# Patient Record
Sex: Male | Born: 1945
Health system: Southern US, Community
[De-identification: ages and names within clinical notes are randomized; demographics above are authoritative.]

## PROBLEM LIST (undated history)

## (undated) DIAGNOSIS — Z87442 Personal history of urinary calculi: Secondary | ICD-10-CM

## (undated) DIAGNOSIS — R002 Palpitations: Secondary | ICD-10-CM

## (undated) DIAGNOSIS — Z860100 Personal history of colon polyps, unspecified: Secondary | ICD-10-CM

## (undated) DIAGNOSIS — E119 Type 2 diabetes mellitus without complications: Secondary | ICD-10-CM

## (undated) DIAGNOSIS — R112 Nausea with vomiting, unspecified: Secondary | ICD-10-CM

## (undated) DIAGNOSIS — K635 Polyp of colon: Secondary | ICD-10-CM

## (undated) DIAGNOSIS — M1990 Unspecified osteoarthritis, unspecified site: Secondary | ICD-10-CM

## (undated) DIAGNOSIS — Z9289 Personal history of other medical treatment: Secondary | ICD-10-CM

## (undated) DIAGNOSIS — E785 Hyperlipidemia, unspecified: Secondary | ICD-10-CM

## (undated) HISTORY — DX: Type 2 diabetes mellitus without complications: E11.9

## (undated) HISTORY — PX: HERNIA REPAIR: SHX51

## (undated) HISTORY — DX: Unspecified osteoarthritis, unspecified site: M19.90

## (undated) HISTORY — PX: APPENDECTOMY: SHX54

## (undated) HISTORY — DX: Polyp of colon: K63.5

## (undated) HISTORY — DX: Hyperlipidemia, unspecified: E78.5

## (undated) HISTORY — DX: Nausea with vomiting, unspecified: R11.2

## (undated) HISTORY — DX: Personal history of other medical treatment: Z92.89

## (undated) HISTORY — PX: COLONOSCOPY: SHX174

## (undated) HISTORY — DX: Personal history of colon polyps, unspecified: Z86.0100

## (undated) HISTORY — PX: CHOLECYSTECTOMY: SHX55

---
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
## 2006-06-01 ENCOUNTER — Ambulatory Visit (HOSPITAL_COMMUNITY): Admission: RE | Admit: 2006-06-01 | Discharge: 2006-06-01 | Payer: Self-pay | Admitting: Internal Medicine

## 2007-07-14 ENCOUNTER — Ambulatory Visit (HOSPITAL_COMMUNITY): Admission: RE | Admit: 2007-07-14 | Discharge: 2007-07-14 | Payer: Self-pay | Admitting: Unknown Physician Specialty

## 2009-05-21 ENCOUNTER — Encounter (INDEPENDENT_AMBULATORY_CARE_PROVIDER_SITE_OTHER): Payer: Self-pay | Admitting: *Deleted

## 2009-05-27 ENCOUNTER — Encounter (INDEPENDENT_AMBULATORY_CARE_PROVIDER_SITE_OTHER): Payer: Self-pay | Admitting: *Deleted

## 2009-05-29 ENCOUNTER — Ambulatory Visit: Payer: Self-pay | Admitting: Internal Medicine

## 2009-06-03 ENCOUNTER — Ambulatory Visit: Payer: Self-pay | Admitting: Internal Medicine

## 2009-06-05 ENCOUNTER — Encounter: Payer: Self-pay | Admitting: Internal Medicine

## 2010-12-29 ENCOUNTER — Encounter (INDEPENDENT_AMBULATORY_CARE_PROVIDER_SITE_OTHER): Payer: Self-pay | Admitting: Surgery

## 2010-12-30 ENCOUNTER — Encounter (INDEPENDENT_AMBULATORY_CARE_PROVIDER_SITE_OTHER): Payer: Self-pay | Admitting: Surgery

## 2010-12-30 ENCOUNTER — Ambulatory Visit (INDEPENDENT_AMBULATORY_CARE_PROVIDER_SITE_OTHER): Payer: 59 | Admitting: Surgery

## 2010-12-30 VITALS — BP 142/86 | HR 76 | Ht 73.0 in | Wt 242.0 lb

## 2010-12-30 DIAGNOSIS — K8010 Calculus of gallbladder with chronic cholecystitis without obstruction: Secondary | ICD-10-CM

## 2010-12-30 DIAGNOSIS — K8020 Calculus of gallbladder without cholecystitis without obstruction: Secondary | ICD-10-CM | POA: Insufficient documentation

## 2010-12-30 NOTE — Progress Notes (Signed)
Cory Walls is a 65 y.o. male .    Chief Complaint  Patient presents with  . Cholelithiasis    eval gb w/stones    HPI HPI This is a 65 year old male who is a patient of Dr. Ivery Quale at [facility]. Recently he underwent his annual physical examination and had some blood work. This showed a mild elevation of his ALT which was felt to be from fatty liver. This may also be a side effect of his statin medication. Dr. Jarold Motto checked hepatitis panels which were negative. He then obtained a right upper quadrant ultrasound which revealed a solitary mobile gallstone within the gallbladder with no sign of cholecystitis. The common bile duct is normal in diameter. The patient now presents for discussion of elective cholecystectomy.  He had one episode last week where he had a sandwich and shortly thereafter, he experienced some RUQ abdominal pain with some mild bloating.  This resolved after an hour.  Past Medical History  Diagnosis Date  . Hyperlipidemia   Macular degeneration  Past Surgical History  Procedure Date  . Hernia repair   . Laparoscopic endo-rectal pull through for hirschsprung's disease   . Hirschsprung's resection   . Appendectomy      Family History  Problem Relation Age of Onset  . Heart disease Father     Social History History  Substance Use Topics  . Smoking status: Current Everyday Smoker -- 0.2 packs/day    Types: Cigarettes, Cigars  . Smokeless tobacco: Current User  . Alcohol Use: 0.6 oz/week    1 Glasses of wine per week    Allergies  Allergen Reactions  . Penicillins     REACTION: as child    Current Outpatient Prescriptions  Medication Sig Dispense Refill  . aspirin 81 MG tablet Take 81 mg by mouth daily.        . beta carotene w/minerals (OCUVITE) tablet Take 1 tablet by mouth 2 (two) times daily.       . fenofibrate micronized (LOFIBRA) 134 MG capsule Take 134 mg by mouth daily before breakfast.        . fish  oil-omega-3 fatty acids 1000 MG capsule Take 2 g by mouth daily.        . multivitamin-iron-minerals-folic acid (CENTRUM) chewable tablet Chew 1 tablet by mouth daily.        . pravastatin (PRAVACHOL) 40 MG tablet Take 40 mg by mouth daily.        . vitamin C (ASCORBIC ACID) 500 MG tablet Take 500 mg by mouth daily.        . vitamin E 400 UNIT capsule Take 400 Units by mouth daily.        Marland Kitchen ipratropium (ATROVENT) 0.03 % nasal spray Place 2 sprays into the nose 3 (three) times daily.        . mometasone (ELOCON) 0.1 % cream Apply 1 application topically daily.          Review of Systems ROS Reviewed - overall negative Physical Exam Physical Exam  WDWN in NAD HEENT:  EOMI, sclera anicteric Neck:  No masses, no thyromegaly Lungs:  CTA bilaterally; normal respiratory effort CV:  Regular rate and rhythm; no murmurs Abd:  +bowel sounds, soft, non-tender, no masses, healed left lower abdominal paramedian incision and R groin incision. Ext:  Well-perfused; no edema Skin:  Warm, dry; no sign of jaundice  Blood pressure 142/86, pulse 76, height 6\' 1"  (1.854 m), weight 242 lb (109.77 kg).  Assessment/Plan  Cholelithiasis with only one episode of symptoms. Mildly elevated transaminases, probably not related to the cholelithiasis.  Recommendations: The patient is currently having minimal symptoms with only one mild episode which may be referred to the cholelithiasis. However, I explained to the patient that he is likely to have increasing episodes of symptoms related to the gallstone. At this point he is in excellent health and would be an excellent surgical candidate. I would recommend an elective laparoscopic cholecystectomy with intraoperative cholangiogram.  His surgery may be slightly complicated by adhesions related to his previous surgeries.  I discussed the procedure in detail.  The patient was given Agricultural engineer.  We discussed the risks and benefits of a laparoscopic cholecystectomy  including, but not limited to bleeding, infection, injury to surrounding structures such as the intestine or liver, bile leak, retained gallstones, need to convert to an open procedure, prolonged diarrhea, blood clots such as  DVT, common bile duct injury, anesthesia risks, and possible need for additional procedures.  We discussed the typical post-operative recovery course.   Lamine Laton K. 12/30/2010, 4:41 PM

## 2010-12-30 NOTE — Patient Instructions (Signed)
Call our surgery schedulers at [phone] to schedule your surgery.  Maintain a lowfat diet.

## 2011-01-02 ENCOUNTER — Encounter (HOSPITAL_COMMUNITY)
Admission: RE | Admit: 2011-01-02 | Discharge: 2011-01-02 | Disposition: A | Discharge status: Home / Self Care | Payer: Managed Care, Other (non HMO) | Source: Ambulatory Visit | Attending: Surgery | Admitting: Surgery

## 2011-01-02 LAB — COMPREHENSIVE METABOLIC PANEL
ALT: 58 U/L — ABNORMAL HIGH (ref 0–53)
AST: 34 U/L (ref 0–37)
Alkaline Phosphatase: 51 U/L (ref 39–117)
BUN: 17 mg/dL (ref 6–23)
CO2: 27 mEq/L (ref 19–32)
Calcium: 9.7 mg/dL (ref 8.4–10.5)
Chloride: 104 mEq/L (ref 96–112)
Creatinine, Ser: 1.11 mg/dL (ref 0.50–1.35)
Total Protein: 7.5 g/dL (ref 6.0–8.3)

## 2011-01-02 LAB — PROTIME-INR
INR: 1.05 (ref 0.00–1.49)
Prothrombin Time: 13.9 seconds (ref 11.6–15.2)

## 2011-01-02 LAB — CBC
Hemoglobin: 15.7 g/dL (ref 13.0–17.0)
MCH: 30.1 pg (ref 26.0–34.0)
MCHC: 34.7 g/dL (ref 30.0–36.0)
Platelets: 223 10*3/uL (ref 150–400)
RDW: 13.4 % (ref 11.5–15.5)

## 2011-01-02 LAB — SURGICAL PCR SCREEN: Staphylococcus aureus: NEGATIVE

## 2011-01-02 LAB — APTT: aPTT: 40 seconds — ABNORMAL HIGH (ref 24–37)

## 2011-01-05 ENCOUNTER — Ambulatory Visit (INDEPENDENT_AMBULATORY_CARE_PROVIDER_SITE_OTHER): Payer: Self-pay | Admitting: Surgery

## 2011-01-06 ENCOUNTER — Other Ambulatory Visit (INDEPENDENT_AMBULATORY_CARE_PROVIDER_SITE_OTHER): Payer: Self-pay | Admitting: Surgery

## 2011-01-06 ENCOUNTER — Ambulatory Visit (HOSPITAL_COMMUNITY)
Admission: RE | Admit: 2011-01-06 | Discharge: 2011-01-06 | Disposition: A | Discharge status: Home / Self Care | Payer: Managed Care, Other (non HMO) | Source: Ambulatory Visit | Attending: Surgery | Admitting: Surgery

## 2011-01-06 DIAGNOSIS — Z79899 Other long term (current) drug therapy: Secondary | ICD-10-CM | POA: Insufficient documentation

## 2011-01-06 DIAGNOSIS — H3530 Unspecified macular degeneration: Secondary | ICD-10-CM | POA: Insufficient documentation

## 2011-01-06 DIAGNOSIS — F17200 Nicotine dependence, unspecified, uncomplicated: Secondary | ICD-10-CM | POA: Insufficient documentation

## 2011-01-06 DIAGNOSIS — E785 Hyperlipidemia, unspecified: Secondary | ICD-10-CM | POA: Insufficient documentation

## 2011-01-06 DIAGNOSIS — K8010 Calculus of gallbladder with chronic cholecystitis without obstruction: Secondary | ICD-10-CM | POA: Insufficient documentation

## 2011-01-06 DIAGNOSIS — K811 Chronic cholecystitis: Secondary | ICD-10-CM

## 2011-01-06 DIAGNOSIS — Z01812 Encounter for preprocedural laboratory examination: Secondary | ICD-10-CM | POA: Insufficient documentation

## 2011-01-06 DIAGNOSIS — Z7982 Long term (current) use of aspirin: Secondary | ICD-10-CM | POA: Insufficient documentation

## 2011-01-07 ENCOUNTER — Telehealth (INDEPENDENT_AMBULATORY_CARE_PROVIDER_SITE_OTHER): Payer: Self-pay

## 2011-01-07 NOTE — Telephone Encounter (Signed)
Mrs. Goodell called stating her husband became dizzy when he stood up this morning around 8:00 am lost his balance and  fell on his left side, she states he hit his head and now has bruising on left side of forehead, his foot is swollen with significant bruising, his abdominal incisions appear to be fine, there's no abdominal swelling or bruising at this time  Per Pattricia Boss, patient need's to go to ED for further evaluation. At this time,  they declined to do to the ED, but agreed to keep a watch for any physical changes to his abdomen and his incisions, head injury (appearance and mental status) and his left foot.  (S/P Lap chole)

## 2011-01-09 NOTE — Op Note (Addendum)
NAME:  Cory Walls, Cory Walls NO.:  [id number]  MEDICAL RECORD NO.:  [id number]  LOCATION:  XRAY                         FACILITY:  [facility]  PHYSICIAN:  Wilmon Arms. Corliss Skains, M.D. DATE OF BIRTH:  10-25-1945  DATE OF PROCEDURE:  01/06/2011 DATE OF DISCHARGE:                              OPERATIVE REPORT   PREOPERATIVE DIAGNOSIS:  Chronic calculous cholecystitis.  POSTOPERATIVE DIAGNOSIS:  Chronic calculous cholecystitis.  PROCEDURE:  Laparoscopic cholecystectomy with intraoperative cholangiogram.  SURGEON:  Wilmon Arms. Indiya Izquierdo, MD  ANESTHESIA:  General.  INDICATIONS:  This is a 65 year old male who was recently noted to have some mild elevation of his liver function tests.  An ultrasound showed a single mobile gallstone within the gallbladder with no sign of cholecystitis.  The patient has had one episode of some right upper quadrant pain that resolved spontaneously.  I discussed the situation with the patient and we have elected to pursue elective cholecystectomy.  DESCRIPTION OF PROCEDURE:  The patient was brought to the operating room and placed in the supine position on the operating room table.  After an adequate level of general anesthesia was obtained, the patient's abdomen was shaved and prepped with ChloraPrep and draped in a sterile fashion. Time-out was taken to ensure the proper patient and proper procedure. The patient has a previous left paramedian incision extending to above his umbilicus.  We made a transverse incision just below the umbilicus after infiltrating with 0.25% Marcaine with epinephrine.  Dissection was carried down to the fascia.  The fascia was incised vertically.  We bluntly entered the peritoneal cavity.  I could feel some adhesions inferior to our incision, but no significant adhesions superiorly.  We placed a stay suture of 0-Vicryl around the fascial opening.  We inserted the Hasson cannula and secured it with a stay  suture. Pneumoperitoneum was obtained by insufflating CO2 and maintaining a maximal pressure of 15 mmHg.  The laparoscope was inserted and the patient was positioned in reverse Trendelenburg tilted to his left.  The patient has a lot of adhesions in the upper abdomen with his omentum stuck to the liver and to the anterior abdominal wall.  I could not visualize the falciform ligament.  I could barely visualize the liver. The right upper quadrant did seem clear.  We placed two 5-mm ports in the right upper quadrant.  We then used cautery scissors to take down the adhesions to anterior abdominal wall in the upper abdomen.  We cleared completely until we were able to visualize the falciform ligament.  I then placed the 11-mm port in the subxiphoid position.  We then spent some time trying to dissect the omentum away from the liver and from the gallbladder.  The gallbladder itself appeared rather thin walled and normal, but there were significant adhesions to the edge of the liver.  We took these down with cautery scissors.  We were finally able to lift the gallbladder over the edge of the liver.  I peeled down the adhesions from the fundus of the gallbladder.  We were able to expose the infundibulum.  We then bluntly dissected around the cystic duct and cystic artery.  The cystic duct was ligated and clipped  distally.  A small opening was created on the cystic duct and a small opening was created on the cystic duct.  A Cook cholangiogram catheter was inserted through a stab incision and threaded in the cystic duct. It was secured with a clip.  A cholangiogram was then obtained which showed a slight leak back around the clips.  Contrast did flow into the duodenum without any sign of obstruction.  We had fairly poor flow proximally into the liver.  I was able to visualize the contrast flowing up in the liver but I did not visualize the bifurcation.  The patient's preoperative liver function  tests were within normal limits.  We then removed the catheter and ligated the cystic duct with clips and divided. We ligated the cystic artery with clips and divided.  Cautery was then used to dissect the gallbladder free from the liver.  We placed the gallbladder in a EndoCatch sac.  We then thoroughly irrigated the gallbladder fossa.  We cauterized for hemostasis.  We then removed the gallbladder in the EndoCatch sac through the umbilical port site.  The patient has a lot of adhesions to the left of his umbilicus as well as in the lower midline.  There actually seemed be some bowel stuck to the anterior abdominal wall but we were clear of this with our fascial incision.  The pursestring sutures were used to close the umbilical fascia.  Pneumoperitoneum was then released as we removed the trocars. 4-0 Monocryl was used to close the skin incisions.  Steri-Strips and clean dressings were applied.  The patient was then extubated and brought to the recovery room in stable condition.  All sponge, instrument, and needle counts were correct.     Wilmon Arms. Corliss Skains, M.D.     MKT/MEDQ  D:  01/06/2011  T:  01/06/2011  Job:  [id number]  Electronically Signed by Manus Rudd M.D. on 01/16/2011 07:31:43 AM

## 2011-01-20 ENCOUNTER — Encounter (INDEPENDENT_AMBULATORY_CARE_PROVIDER_SITE_OTHER): Payer: Self-pay | Admitting: Surgery

## 2011-01-20 ENCOUNTER — Ambulatory Visit (INDEPENDENT_AMBULATORY_CARE_PROVIDER_SITE_OTHER): Payer: Managed Care, Other (non HMO) | Admitting: Surgery

## 2011-01-20 DIAGNOSIS — Z9049 Acquired absence of other specified parts of digestive tract: Secondary | ICD-10-CM

## 2011-01-20 DIAGNOSIS — Z9889 Other specified postprocedural states: Secondary | ICD-10-CM

## 2011-01-20 DIAGNOSIS — K8010 Calculus of gallbladder with chronic cholecystitis without obstruction: Secondary | ICD-10-CM

## 2011-01-20 NOTE — Progress Notes (Signed)
Status post laparoscopic cholecystectomy with intraoperative cholangiogram. The pathology confirmed chronic calculus cholecystitis. The patient is doing well. Appetite is good. Occasionally he has a loose bowel movement with certain types of food. This seems to be improving. His incisions are well healed with no sign of infection. No sign of umbilical hernia.  The patient may resume full activity. Followup on a p.r.n. basis.

## 2011-08-13 DIAGNOSIS — H353121 Nonexudative age-related macular degeneration, left eye, early dry stage: Secondary | ICD-10-CM | POA: Insufficient documentation

## 2011-08-13 DIAGNOSIS — H25813 Combined forms of age-related cataract, bilateral: Secondary | ICD-10-CM | POA: Insufficient documentation

## 2012-03-28 ENCOUNTER — Encounter: Payer: Self-pay | Admitting: Internal Medicine

## 2012-05-11 ENCOUNTER — Encounter: Payer: Self-pay | Admitting: Internal Medicine

## 2012-05-11 ENCOUNTER — Ambulatory Visit (AMBULATORY_SURGERY_CENTER): Payer: Managed Care, Other (non HMO) | Admitting: *Deleted

## 2012-05-11 VITALS — Ht 72.0 in | Wt 242.2 lb

## 2012-05-11 DIAGNOSIS — Z1211 Encounter for screening for malignant neoplasm of colon: Secondary | ICD-10-CM

## 2012-05-11 MED ORDER — NA SULFATE-K SULFATE-MG SULF 17.5-3.13-1.6 GM/177ML PO SOLN: ORAL | Status: DC

## 2012-05-25 ENCOUNTER — Ambulatory Visit (AMBULATORY_SURGERY_CENTER): Payer: Managed Care, Other (non HMO) | Admitting: Internal Medicine

## 2012-05-25 ENCOUNTER — Encounter: Payer: Self-pay | Admitting: Internal Medicine

## 2012-05-25 VITALS — BP 122/73 | HR 72 | Temp 96.8°F | Resp 14 | Ht 72.0 in | Wt 242.0 lb

## 2012-05-25 DIAGNOSIS — Z860100 Personal history of colon polyps, unspecified: Secondary | ICD-10-CM | POA: Insufficient documentation

## 2012-05-25 DIAGNOSIS — D126 Benign neoplasm of colon, unspecified: Secondary | ICD-10-CM

## 2012-05-25 DIAGNOSIS — Z1211 Encounter for screening for malignant neoplasm of colon: Secondary | ICD-10-CM

## 2012-05-25 MED ORDER — SODIUM CHLORIDE 0.9 % IV SOLN: 500.0000 mL | INTRAVENOUS | Status: DC

## 2012-05-25 NOTE — Patient Instructions (Addendum)
Four small polyps were removed today.  I will let you know but suspect I will recommend another 3 year repeat. Will remind myself to sit down with you about the prep so we can try to prevent the nausea problems you have had the last 2 times.  Thank you for choosing me and [facility].  Iva Boop, MD, FACG   YOU HAD AN ENDOSCOPIC PROCEDURE TODAY AT THE [facility]: Refer to the procedure report that was given to you for any specific questions about what was found during the examination.  If the procedure report does not answer your questions, please call your gastroenterologist to clarify.  If you requested that your care partner not be given the details of your procedure findings, then the procedure report has been included in a sealed envelope for you to review at your convenience later.  YOU SHOULD EXPECT: Some feelings of bloating in the abdomen. Passage of more gas than usual.  Walking can help get rid of the air that was put into your GI tract during the procedure and reduce the bloating. If you had a lower endoscopy (such as a colonoscopy or flexible sigmoidoscopy) you may notice spotting of blood in your stool or on the toilet paper. If you underwent a bowel prep for your procedure, then you may not have a normal bowel movement for a few days.  DIET: Your first meal following the procedure should be a light meal and then it is ok to progress to your normal diet.  A half-sandwich or bowl of soup is an example of a good first meal.  Heavy or fried foods are harder to digest and may make you feel nauseous or bloated.  Likewise meals heavy in dairy and vegetables can cause extra gas to form and this can also increase the bloating.  Drink plenty of fluids but you should avoid alcoholic beverages for 24 hours.  ACTIVITY: Your care partner should take you home directly after the procedure.  You should plan to take it easy, moving slowly for the rest of the day.  You can  resume normal activity the day after the procedure however you should NOT DRIVE or use heavy machinery for 24 hours (because of the sedation medicines used during the test).    SYMPTOMS TO REPORT IMMEDIATELY: A gastroenterologist can be reached at any hour.  During normal business hours, 8:30 AM to 5:00 PM Monday through Friday, call [phone].  After hours and on weekends, please call the GI answering service at [phone] who will take a message and have the physician on call contact you.   Following lower endoscopy (colonoscopy or flexible sigmoidoscopy):  Excessive amounts of blood in the stool  Significant tenderness or worsening of abdominal pains  Swelling of the abdomen that is new, acute  Fever of 100F or higher  Following upper endoscopy (EGD)  Vomiting of blood or coffee ground material  New chest pain or pain under the shoulder blades  Painful or persistently difficult swallowing  New shortness of breath  Fever of 100F or higher  Black, tarry-looking stools  FOLLOW UP: If any biopsies were taken you will be contacted by phone or by letter within the next 1-3 weeks.  Call your gastroenterologist if you have not heard about the biopsies in 3 weeks.  Our staff will call the home number listed on your records the next business day following your procedure to check on you and address any questions or concerns  that you may have at that time regarding the information given to you following your procedure. This is a courtesy call and so if there is no answer at the home number and we have not heard from you through the emergency physician on call, we will assume that you have returned to your regular daily activities without incident.  SIGNATURES/CONFIDENTIALITY: You and/or your care partner have signed paperwork which will be entered into your electronic medical record.  These signatures attest to the fact that that the information above on your After Visit Summary has been  reviewed and is understood.  Full responsibility of the confidentiality of this discharge information lies with you and/or your care-partner.

## 2012-05-25 NOTE — Op Note (Addendum)
[facility] [address] [place], [address]   COLONOSCOPY PROCEDURE REPORT  PATIENT: Cory Walls, Cory Walls  MR#: [id number] BIRTHDATE: [birth year] , 66  yrs. old GENDER: Male ENDOSCOPIST: Iva Boop, MD, [facility] PROCEDURE DATE:  05/25/2012 PROCEDURE:   Colonoscopy with biopsy and snare polypectomy ASA CLASS:   Class II INDICATIONS:Screening and surveillance,personal history of colonic polyps and Patient's personal history of adenomatous colon polyps.  MEDICATIONS: MAC sedation, administered by CRNA, These medications were titrated to patient response per physician's verbal order, and propofol (Diprivan) 400mg  IV  DESCRIPTION OF PROCEDURE:   After the risks benefits and alternatives of the procedure were thoroughly explained, informed consent was obtained.  A digital rectal exam revealed decreased sphincter tone and A digital rectal exam revealed the prostate was not enlarged.   The LB [id number]  endoscope was introduced through the anus and advanced to the cecum, which was identified by both the appendix and ileocecal valve. No adverse events experienced.   The quality of the prep was Suprep adequate  The instrument was then slowly withdrawn as the colon was fully examined.      COLON FINDINGS: Four diminutive polypoid shaped sessile polyps were found in the ascending colon and descending colon.  A polypectomy was performed with cold forceps and with a cold snare.  The resection was complete and the polyp tissue was completely retrieved.   The colon mucosa was otherwise normal.   A right colon retroflexion was performed.  Retroflexed views revealed no abnormalities. The time to cecum=2 minutes 51 seconds.  Withdrawal time=15 minutes 46 seconds.  The scope was withdrawn and the procedure completed. COMPLICATIONS: There were no complications.  ENDOSCOPIC IMPRESSION: 1.   Four diminutive sessile polyps were found in the ascending colon and descending  colon; polypectomy was performed with cold forceps and with a cold snare 2.   The colon mucosa was otherwise normal with evidence of Hirschsprung surgery in rectum, adequate prep 3.   Prior adenomas ( 6 in 2006 and also before that)  RECOMMENDATIONS: Timing of repeat colonoscopy will be determined by pathology findings. Schedule office visit before next colonoscopy to treat for nausea with preps (x2 now)   eSigned:  Iva Boop, MD, [facility] 05/25/2012 12:47 PMRevised: 05/25/2012 12:47 PM cc: Jarome Matin, MD and The Patient

## 2012-05-25 NOTE — Progress Notes (Signed)
Patient did not experience any of the following events: a burn prior to discharge; a fall within the facility; wrong site/side/patient/procedure/implant event; or a hospital transfer or hospital admission upon discharge from the facility. (G8907) Patient did not have preoperative order for IV antibiotic SSI prophylaxis. (G8918)  

## 2012-05-25 NOTE — Progress Notes (Signed)
Called to room to assist during endoscopic procedure.  Patient ID and intended procedure confirmed with present staff. Received instructions for my participation in the procedure from the performing physician.  

## 2012-05-26 ENCOUNTER — Telehealth: Payer: Self-pay | Admitting: *Deleted

## 2012-05-26 NOTE — Telephone Encounter (Signed)
No answer or voicemail on f/u callback

## 2012-06-01 ENCOUNTER — Encounter: Payer: Self-pay | Admitting: Internal Medicine

## 2012-06-01 NOTE — Progress Notes (Signed)
Quick Note:  4 diminutive adenomas Repeat colon about 05/2015 ______

## 2013-03-07 DIAGNOSIS — D485 Neoplasm of uncertain behavior of skin: Secondary | ICD-10-CM | POA: Diagnosis not present

## 2013-03-07 DIAGNOSIS — L821 Other seborrheic keratosis: Secondary | ICD-10-CM | POA: Diagnosis not present

## 2013-03-07 DIAGNOSIS — D239 Other benign neoplasm of skin, unspecified: Secondary | ICD-10-CM | POA: Diagnosis not present

## 2013-04-05 ENCOUNTER — Other Ambulatory Visit: Payer: Self-pay | Admitting: Dermatology

## 2013-04-05 DIAGNOSIS — L723 Sebaceous cyst: Secondary | ICD-10-CM | POA: Diagnosis not present

## 2013-04-05 DIAGNOSIS — D1739 Benign lipomatous neoplasm of skin and subcutaneous tissue of other sites: Secondary | ICD-10-CM | POA: Diagnosis not present

## 2013-04-05 DIAGNOSIS — D485 Neoplasm of uncertain behavior of skin: Secondary | ICD-10-CM | POA: Diagnosis not present

## 2013-06-01 DIAGNOSIS — H2510 Age-related nuclear cataract, unspecified eye: Secondary | ICD-10-CM | POA: Diagnosis not present

## 2013-06-01 DIAGNOSIS — H353290 Exudative age-related macular degeneration, unspecified eye, stage unspecified: Secondary | ICD-10-CM | POA: Diagnosis not present

## 2013-06-01 DIAGNOSIS — H353190 Nonexudative age-related macular degeneration, unspecified eye, stage unspecified: Secondary | ICD-10-CM | POA: Diagnosis not present

## 2013-06-23 DIAGNOSIS — H3530 Unspecified macular degeneration: Secondary | ICD-10-CM | POA: Diagnosis not present

## 2013-06-26 DIAGNOSIS — D72829 Elevated white blood cell count, unspecified: Secondary | ICD-10-CM | POA: Diagnosis not present

## 2013-12-18 DIAGNOSIS — M7660 Achilles tendinitis, unspecified leg: Secondary | ICD-10-CM | POA: Diagnosis not present

## 2013-12-28 DIAGNOSIS — M7660 Achilles tendinitis, unspecified leg: Secondary | ICD-10-CM | POA: Diagnosis not present

## 2014-01-01 DIAGNOSIS — Z125 Encounter for screening for malignant neoplasm of prostate: Secondary | ICD-10-CM | POA: Diagnosis not present

## 2014-01-01 DIAGNOSIS — Z79899 Other long term (current) drug therapy: Secondary | ICD-10-CM | POA: Diagnosis not present

## 2014-01-01 DIAGNOSIS — E782 Mixed hyperlipidemia: Secondary | ICD-10-CM | POA: Diagnosis not present

## 2014-01-02 DIAGNOSIS — M7660 Achilles tendinitis, unspecified leg: Secondary | ICD-10-CM | POA: Diagnosis not present

## 2014-01-04 DIAGNOSIS — Z1212 Encounter for screening for malignant neoplasm of rectum: Secondary | ICD-10-CM | POA: Diagnosis not present

## 2014-01-04 DIAGNOSIS — M25579 Pain in unspecified ankle and joints of unspecified foot: Secondary | ICD-10-CM | POA: Diagnosis not present

## 2014-01-08 DIAGNOSIS — H6120 Impacted cerumen, unspecified ear: Secondary | ICD-10-CM | POA: Diagnosis not present

## 2014-01-08 DIAGNOSIS — N182 Chronic kidney disease, stage 2 (mild): Secondary | ICD-10-CM | POA: Diagnosis not present

## 2014-01-08 DIAGNOSIS — Z79899 Other long term (current) drug therapy: Secondary | ICD-10-CM | POA: Diagnosis not present

## 2014-01-08 DIAGNOSIS — F17200 Nicotine dependence, unspecified, uncomplicated: Secondary | ICD-10-CM | POA: Diagnosis not present

## 2014-01-08 DIAGNOSIS — Z1331 Encounter for screening for depression: Secondary | ICD-10-CM | POA: Diagnosis not present

## 2014-01-08 DIAGNOSIS — Z125 Encounter for screening for malignant neoplasm of prostate: Secondary | ICD-10-CM | POA: Diagnosis not present

## 2014-01-08 DIAGNOSIS — E782 Mixed hyperlipidemia: Secondary | ICD-10-CM | POA: Diagnosis not present

## 2014-01-08 DIAGNOSIS — D72829 Elevated white blood cell count, unspecified: Secondary | ICD-10-CM | POA: Diagnosis not present

## 2014-01-08 DIAGNOSIS — Z0000 Encounter for general adult medical examination without abnormal findings: Secondary | ICD-10-CM | POA: Diagnosis not present

## 2014-01-08 DIAGNOSIS — R209 Unspecified disturbances of skin sensation: Secondary | ICD-10-CM | POA: Diagnosis not present

## 2014-01-09 DIAGNOSIS — M25579 Pain in unspecified ankle and joints of unspecified foot: Secondary | ICD-10-CM | POA: Diagnosis not present

## 2014-01-11 DIAGNOSIS — M25579 Pain in unspecified ankle and joints of unspecified foot: Secondary | ICD-10-CM | POA: Diagnosis not present

## 2014-01-15 DIAGNOSIS — M25579 Pain in unspecified ankle and joints of unspecified foot: Secondary | ICD-10-CM | POA: Diagnosis not present

## 2014-02-27 ENCOUNTER — Encounter: Payer: Self-pay | Admitting: Internal Medicine

## 2014-03-15 DIAGNOSIS — H3531 Nonexudative age-related macular degeneration: Secondary | ICD-10-CM | POA: Diagnosis not present

## 2014-03-15 DIAGNOSIS — H3532 Exudative age-related macular degeneration: Secondary | ICD-10-CM | POA: Diagnosis not present

## 2014-03-15 DIAGNOSIS — H2513 Age-related nuclear cataract, bilateral: Secondary | ICD-10-CM | POA: Diagnosis not present

## 2014-03-20 DIAGNOSIS — D239 Other benign neoplasm of skin, unspecified: Secondary | ICD-10-CM | POA: Diagnosis not present

## 2014-03-20 DIAGNOSIS — Z86018 Personal history of other benign neoplasm: Secondary | ICD-10-CM | POA: Diagnosis not present

## 2014-03-20 DIAGNOSIS — L821 Other seborrheic keratosis: Secondary | ICD-10-CM | POA: Diagnosis not present

## 2014-03-21 DIAGNOSIS — H1013 Acute atopic conjunctivitis, bilateral: Secondary | ICD-10-CM | POA: Diagnosis not present

## 2014-06-29 DIAGNOSIS — H3531 Nonexudative age-related macular degeneration: Secondary | ICD-10-CM | POA: Diagnosis not present

## 2014-08-02 DIAGNOSIS — M1812 Unilateral primary osteoarthritis of first carpometacarpal joint, left hand: Secondary | ICD-10-CM | POA: Diagnosis not present

## 2014-09-04 DIAGNOSIS — M79671 Pain in right foot: Secondary | ICD-10-CM | POA: Diagnosis not present

## 2014-10-09 ENCOUNTER — Telehealth: Payer: Self-pay | Admitting: Internal Medicine

## 2014-10-09 NOTE — Telephone Encounter (Signed)
Patient with a 1 week history of constipation.  He has tried numerous OTC laxative products with no relief. He will come in tomorrow and see Dr. Carlean Purl at 10:00.  He is asked to arrive 15 minutes early to fill out paperwork.

## 2014-10-10 ENCOUNTER — Encounter: Payer: Self-pay | Admitting: Internal Medicine

## 2014-10-10 ENCOUNTER — Ambulatory Visit (INDEPENDENT_AMBULATORY_CARE_PROVIDER_SITE_OTHER)
Admission: RE | Admit: 2014-10-10 | Discharge: 2014-10-10 | Disposition: A | Discharge status: Home / Self Care | Payer: Medicare Other | Source: Ambulatory Visit | Attending: Internal Medicine | Admitting: Internal Medicine

## 2014-10-10 ENCOUNTER — Other Ambulatory Visit (INDEPENDENT_AMBULATORY_CARE_PROVIDER_SITE_OTHER): Payer: Medicare Other

## 2014-10-10 ENCOUNTER — Ambulatory Visit (INDEPENDENT_AMBULATORY_CARE_PROVIDER_SITE_OTHER): Payer: Medicare Other | Admitting: Internal Medicine

## 2014-10-10 VITALS — BP 134/82 | HR 68 | Ht 72.0 in | Wt 236.0 lb

## 2014-10-10 DIAGNOSIS — K5900 Constipation, unspecified: Secondary | ICD-10-CM | POA: Diagnosis not present

## 2014-10-10 DIAGNOSIS — R10819 Abdominal tenderness, unspecified site: Secondary | ICD-10-CM | POA: Diagnosis not present

## 2014-10-10 LAB — BASIC METABOLIC PANEL
BUN: 19 mg/dL (ref 6–23)
CHLORIDE: 103 meq/L (ref 96–112)
CO2: 31 mEq/L (ref 19–32)
CREATININE: 1.54 mg/dL — AB (ref 0.40–1.50)
Calcium: 9.8 mg/dL (ref 8.4–10.5)
GFR: 47.87 mL/min — ABNORMAL LOW (ref >60.00)
Glucose, Bld: 107 mg/dL — ABNORMAL HIGH (ref 70–99)
POTASSIUM: 4.5 meq/L (ref 3.5–5.1)
SODIUM: 138 meq/L (ref 135–145)

## 2014-10-10 LAB — CBC WITH DIFFERENTIAL/PLATELET
BASOS ABS: 0 10*3/uL (ref 0.0–0.1)
Basophils Relative: 0.4 % (ref 0.0–3.0)
EOS ABS: 0.2 10*3/uL (ref 0.0–0.7)
Eosinophils Relative: 1.7 % (ref 0.0–5.0)
HCT: 47 % (ref 39.0–52.0)
HEMOGLOBIN: 16.1 g/dL (ref 13.0–17.0)
LYMPHS PCT: 31.3 % (ref 12.0–46.0)
Lymphs Abs: 3.2 10*3/uL (ref 0.7–4.0)
MCHC: 34.2 g/dL (ref 30.0–36.0)
MCV: 87.2 fl (ref 78.0–100.0)
MONOS PCT: 6.9 % (ref 3.0–12.0)
Monocytes Absolute: 0.7 10*3/uL (ref 0.1–1.0)
NEUTROS ABS: 6.1 10*3/uL (ref 1.4–7.7)
NEUTROS PCT: 59.7 % (ref 43.0–77.0)
Platelets: 248 10*3/uL (ref 150.0–400.0)
RBC: 5.4 Mil/uL (ref 4.22–5.81)
RDW: 13.4 % (ref 11.5–15.5)
WBC: 10.2 10*3/uL (ref 4.0–10.5)

## 2014-10-10 NOTE — Progress Notes (Signed)
Quick Note:  Tell him I am sorry and I forgot about his prep problems - we discussed today   Have him purchase 2 fleet enemas (saline not phospho soda) Purchase bisacodyl tablets and take 4 with several glasses   I also suggest he do a renal ultrasound re: abnormal kidney function ______

## 2014-10-10 NOTE — Progress Notes (Signed)
Subjective:    Patient ID: Cory Walls, male    DOB: May 28, 1946, 69 y.o.   MRN: [id number] Cc: constipation HPI  The patient is a very nice man known to me from prior colonoscopy procedures. He has a history of a bowel obstruction he says. He has not moved his bowels in a week and was usually regular. He is been a little bloated and nauseous and mild abdominal discomfort as described. There was a sudden change. He denies any other problems. He is been doing well no new medications. He indicates he is urinating without difficulty. He has a remote history of Hirschsprung's and had surgery as a child.  He has had a lot of nausea with colonoscopy preps in 2010 and 2005. He has difficulty tolerating large-volume purge preps. Allergies  Allergen Reactions  . Penicillins     REACTION: as child    Current outpatient prescriptions:  .  aspirin 81 MG tablet, Take 81 mg by mouth daily.  , Disp: , Rfl:  .  beta carotene w/minerals (OCUVITE) tablet, Take 1 tablet by mouth daily. , Disp: , Rfl:  .  fenofibrate micronized (LOFIBRA) 134 MG capsule, Take 134 mg by mouth daily before breakfast.  , Disp: , Rfl:  .  fish oil-omega-3 fatty acids 1000 MG capsule, Take 2 g by mouth daily.  , Disp: , Rfl:  .  MULTIPLE VITAMINS-MINERALS PO, Take 1 tablet by mouth daily., Disp: , Rfl:  .  pravastatin (PRAVACHOL) 40 MG tablet, Take 40 mg by mouth daily.  , Disp: , Rfl:  .  vitamin C (ASCORBIC ACID) 500 MG tablet, Take 500 mg by mouth daily.  , Disp: , Rfl:  .  vitamin E 400 UNIT capsule, Take 400 Units by mouth daily.  , Disp: , Rfl:   Past Medical History  Diagnosis Date  . Hyperlipidemia   . Colon polyps    Past Surgical History  Procedure Laterality Date  . Hernia repair    . Laparoscopic endo-rectal pull through for hirschsprung's disease  1954  . Hirschsprung's resection  1961  . Appendectomy    . Cholecystectomy    . Colonoscopy     History   Social History  . Marital Status: Married   Spouse Name: N/A  . Number of Children: 2  . Years of Education: N/A   Occupational History  . Retired    Social History Main Topics  . Smoking status: Current Every Day Smoker -- 0.25 packs/day    Types: Cigarettes, Cigars  . Smokeless tobacco: Never Used     Comment: smokes 2 to 3 cigarettes daily  . Alcohol Use: 0.6 oz/week    1 Glasses of wine per week     Comment: Occassionally  . Drug Use: No      Family History  Problem Relation Age of Onset  . Heart disease Father   . Colon cancer Neg Hx   . Stomach cancer Neg Hx   . Colon polyps Neg Hx   . Diabetes Neg Hx   . Kidney disease Neg Hx     Review of Systems As per history of present illness.    Objective:   Physical Exam @BP  134/82 mmHg  Pulse 68  Ht 6' (1.829 m)  Wt 236 lb (107.049 kg)  BMI 32.00 kg/m2@  General:  NAD Eyes:   anicteric Heart:: S1S2 no rubs, murmurs or gallops Abdomen:  soft and  Mildly tender LQ, BS+ not distended Ext:   no  edema, cyanosis or clubbing Rectal: Soft tan stool no mass    Data Reviewed:  Prior colonoscopy 2010.    Assessment & Plan:   1. Acute constipation   2. Lower abdominal tenderness - mild    Cause of these problems not entirely clear. I had him do two-view abdomen and labs.  CLINICAL DATA: Constipation. Abdominal pain.  EXAM: ABDOMEN - 2 VIEW  COMPARISON: None.  FINDINGS: Soft tissue structures are unremarkable. No bowel distention. No free air. Degenerative changes lumbar spine and both hips. Right upper quadrant surgical clips. Sutures are noted in the pelvis degenerative changes thoracolumbar spine and both hips.  IMPRESSION: No acute abnormality. No bowel distention. No free air.   Electronically Signed  By: Marcello Moores Register  On: 10/10/2014 12:57   Lab Results  Component Value Date   CREATININE 1.54* 10/10/2014   BUN 19 10/10/2014   NA 138 10/10/2014   K 4.5 10/10/2014   CL 103 10/10/2014   CO2 31 10/10/2014     I had  called him and we recommended a MiraLAX purge but I had forgotten that colonoscopy preps cause problems as I mentioned above. In the interim he did try to take some MiraLAX and he tolerated it so is going to try a lower dose of that and see if he can handle that as well as possibly try some bisacodyl for his constipation and then call me back.  He will contact Dr. Janie Morning about the new renal insufficiency. He is not aware of having that in the past. It seems relatively mild but with his lower abdominal distention and symptoms, although I can't make a link between constipation of that I would consider a renal ultrasound to make sure there is no obstruction there. When I talked to him on the phone this afternoon he denied any major bladder outlet symptoms.  I appreciate the opportunity to care for this patient. CC: PATERSON, DANIEL

## 2014-10-10 NOTE — Progress Notes (Signed)
Quick Note:  Labs show abnomal kidney function Has he been told this before? Xray is ok Needs a MiraLax purge and to call me tomorrow with results please ______

## 2014-10-10 NOTE — Patient Instructions (Signed)
Your physician has requested that you go to the basement for lab work before leaving today.   Please go to the basement before leaving today and have an x-ray taken.    Dr. Carlean Purl will call you with results and plans this afternoon.   I appreciate the opportunity to care for you.

## 2014-10-10 NOTE — Progress Notes (Signed)
Quick Note:  I called him He tried some MiraLax (1 dose) and said not too bad so will try some more though not a purge. He will contact Dr. Philip Aspen about the abnormal kidney fx. He will call me back re: response to MiraLax. Also advised trying some dulcolax tabs. ______

## 2014-10-15 DIAGNOSIS — Z6832 Body mass index (BMI) 32.0-32.9, adult: Secondary | ICD-10-CM | POA: Diagnosis not present

## 2014-10-15 DIAGNOSIS — E782 Mixed hyperlipidemia: Secondary | ICD-10-CM | POA: Diagnosis not present

## 2014-10-15 DIAGNOSIS — N182 Chronic kidney disease, stage 2 (mild): Secondary | ICD-10-CM | POA: Diagnosis not present

## 2014-10-18 DIAGNOSIS — N182 Chronic kidney disease, stage 2 (mild): Secondary | ICD-10-CM | POA: Diagnosis not present

## 2014-12-10 ENCOUNTER — Other Ambulatory Visit: Payer: Self-pay

## 2015-01-09 DIAGNOSIS — Z125 Encounter for screening for malignant neoplasm of prostate: Secondary | ICD-10-CM | POA: Diagnosis not present

## 2015-01-09 DIAGNOSIS — E782 Mixed hyperlipidemia: Secondary | ICD-10-CM | POA: Diagnosis not present

## 2015-01-09 DIAGNOSIS — N182 Chronic kidney disease, stage 2 (mild): Secondary | ICD-10-CM | POA: Diagnosis not present

## 2015-01-16 DIAGNOSIS — L723 Sebaceous cyst: Secondary | ICD-10-CM | POA: Diagnosis not present

## 2015-01-16 DIAGNOSIS — E782 Mixed hyperlipidemia: Secondary | ICD-10-CM | POA: Diagnosis not present

## 2015-01-16 DIAGNOSIS — Z0000 Encounter for general adult medical examination without abnormal findings: Secondary | ICD-10-CM | POA: Diagnosis not present

## 2015-01-16 DIAGNOSIS — Z1389 Encounter for screening for other disorder: Secondary | ICD-10-CM | POA: Diagnosis not present

## 2015-01-16 DIAGNOSIS — R7301 Impaired fasting glucose: Secondary | ICD-10-CM | POA: Diagnosis not present

## 2015-01-16 DIAGNOSIS — Z6831 Body mass index (BMI) 31.0-31.9, adult: Secondary | ICD-10-CM | POA: Diagnosis not present

## 2015-01-17 DIAGNOSIS — H3531 Nonexudative age-related macular degeneration: Secondary | ICD-10-CM | POA: Diagnosis not present

## 2015-01-17 DIAGNOSIS — H2513 Age-related nuclear cataract, bilateral: Secondary | ICD-10-CM | POA: Diagnosis not present

## 2015-01-17 DIAGNOSIS — H3532 Exudative age-related macular degeneration: Secondary | ICD-10-CM | POA: Diagnosis not present

## 2015-01-21 DIAGNOSIS — Z1212 Encounter for screening for malignant neoplasm of rectum: Secondary | ICD-10-CM | POA: Diagnosis not present

## 2015-03-18 ENCOUNTER — Encounter: Payer: Self-pay | Admitting: Internal Medicine

## 2015-04-10 DIAGNOSIS — D225 Melanocytic nevi of trunk: Secondary | ICD-10-CM | POA: Diagnosis not present

## 2015-04-10 DIAGNOSIS — Z86018 Personal history of other benign neoplasm: Secondary | ICD-10-CM | POA: Diagnosis not present

## 2015-04-10 DIAGNOSIS — L821 Other seborrheic keratosis: Secondary | ICD-10-CM | POA: Diagnosis not present

## 2015-05-03 ENCOUNTER — Telehealth: Payer: Self-pay | Admitting: *Deleted

## 2015-05-03 ENCOUNTER — Other Ambulatory Visit: Payer: Self-pay | Admitting: Internal Medicine

## 2015-05-03 DIAGNOSIS — R112 Nausea with vomiting, unspecified: Secondary | ICD-10-CM

## 2015-05-03 NOTE — Telephone Encounter (Signed)
Called pt and informed of labs needing to be drawn before pre-visit so we can determine which bowel prep would be best for him, he will come in on Monday 05/06/15 to get labs drawn-adm

## 2015-05-03 NOTE — Telephone Encounter (Signed)
Pt schedule for pre-visit on 11/22 for colonoscopy 12/6,  note from ov 10/10/14 states pt has had difficulty with large volume prep purge, last colon 2013 notes say "schedule ov before next colon for pre-treatment of nausea with prep (x2 now)", is there something we can set up for them in pre-visit or do your still want an OV?, pls adv-adm

## 2015-05-03 NOTE — Telephone Encounter (Signed)
We can use Prep-o-Pik - though need to know his renal function so I will order a BMET now and please contact him to come get that done - he can get that done anytime  Other thing would be for him to not be constipated i.e. Take daily MiraLax for 1 week or more prior to colonoscopy and I would also give him 4 bisacodyl on day before prep  Thanks

## 2015-05-06 ENCOUNTER — Other Ambulatory Visit (INDEPENDENT_AMBULATORY_CARE_PROVIDER_SITE_OTHER): Payer: Medicare Other

## 2015-05-06 DIAGNOSIS — R110 Nausea: Secondary | ICD-10-CM

## 2015-05-06 DIAGNOSIS — R112 Nausea with vomiting, unspecified: Secondary | ICD-10-CM

## 2015-05-06 LAB — BASIC METABOLIC PANEL
BUN: 17 mg/dL (ref 6–23)
CALCIUM: 9.7 mg/dL (ref 8.4–10.5)
CO2: 26 meq/L (ref 19–32)
CREATININE: 1.16 mg/dL (ref 0.40–1.50)
Chloride: 103 mEq/L (ref 96–112)
GFR: 66.28 mL/min (ref >60.00)
GLUCOSE: 140 mg/dL — AB (ref 70–99)
Potassium: 4.1 mEq/L (ref 3.5–5.1)
Sodium: 139 mEq/L (ref 135–145)

## 2015-05-06 NOTE — Progress Notes (Signed)
Quick Note:  BMET is ok no problem using Prep-o-Pik ______

## 2015-05-07 ENCOUNTER — Ambulatory Visit (AMBULATORY_SURGERY_CENTER): Payer: Self-pay

## 2015-05-07 ENCOUNTER — Telehealth: Payer: Self-pay | Admitting: Internal Medicine

## 2015-05-07 VITALS — Ht 72.0 in | Wt 229.8 lb

## 2015-05-07 DIAGNOSIS — Z860100 Personal history of colon polyps, unspecified: Secondary | ICD-10-CM

## 2015-05-07 NOTE — Telephone Encounter (Signed)
Pt states he was told to take miralax daily starting 12-1. His question is does he take it just for 2 days?  Instructed to take the miralax daily thru Monday and then the dulcolax and prep as directed. He verbalized understanding of instructions today Lelan Pons PV

## 2015-05-07 NOTE — Progress Notes (Signed)
No allergies to eggs or soy No diet/weight loss meds No home oxygen No problems with anesthesia  Has email and internet; refused emmi

## 2015-05-21 ENCOUNTER — Telehealth: Payer: Self-pay | Admitting: Emergency Medicine

## 2015-05-21 ENCOUNTER — Ambulatory Visit (AMBULATORY_SURGERY_CENTER): Payer: Medicare Other | Admitting: Internal Medicine

## 2015-05-21 ENCOUNTER — Telehealth: Payer: Self-pay | Admitting: Internal Medicine

## 2015-05-21 ENCOUNTER — Encounter: Payer: Self-pay | Admitting: Internal Medicine

## 2015-05-21 VITALS — BP 139/61 | HR 84 | Temp 99.3°F | Resp 20 | Ht 72.0 in | Wt 236.0 lb

## 2015-05-21 DIAGNOSIS — D125 Benign neoplasm of sigmoid colon: Secondary | ICD-10-CM | POA: Diagnosis not present

## 2015-05-21 DIAGNOSIS — D123 Benign neoplasm of transverse colon: Secondary | ICD-10-CM | POA: Diagnosis not present

## 2015-05-21 DIAGNOSIS — Z86010 Personal history of colonic polyps: Secondary | ICD-10-CM

## 2015-05-21 DIAGNOSIS — R112 Nausea with vomiting, unspecified: Secondary | ICD-10-CM

## 2015-05-21 HISTORY — DX: Nausea with vomiting, unspecified: R11.2

## 2015-05-21 MED ORDER — SODIUM CHLORIDE 0.9 % IV SOLN
4.0000 mg | Freq: Once | INTRAVENOUS | Status: AC
  Administered 2015-05-21: 4 mg via INTRAVENOUS

## 2015-05-21 MED ORDER — SODIUM CHLORIDE 0.9 % IV SOLN: 500.0000 mL | INTRAVENOUS | Status: DC

## 2015-05-21 NOTE — Progress Notes (Signed)
Pt received in recovery. Drowsy, alert, aroused. Denies nausea

## 2015-05-21 NOTE — Progress Notes (Signed)
Called to room to assist during endoscopic procedure.  Patient ID and intended procedure confirmed with present staff. Received instructions for my participation in the procedure from the performing physician.  

## 2015-05-21 NOTE — Op Note (Signed)
[facility]  [address] [place], [address]   COLONOSCOPY PROCEDURE REPORT  PATIENT: Cory Walls, Cory Walls  MR#: [id number] BIRTHDATE: [birth year] , 61  yrs. old GENDER: male ENDOSCOPIST: Gatha Mayer, MD, [facility] PROCEDURE DATE:  05/21/2015 PROCEDURE:   Colonoscopy, surveillance and Colonoscopy with snare polypectomy First Screening Colonoscopy - Avg.  risk and is 50 yrs.  old or older - No.  Prior Negative Screening - Now for repeat screening. N/A  History of Adenoma - Now for follow-up colonoscopy & has been > or = to 3 yrs.  Yes hx of adenoma.  Has been 3 or more years since last colonoscopy.  Polyps removed today? Yes ASA CLASS:   Class I INDICATIONS:Surveillance due to prior colonic neoplasia and PH Colon Adenoma. MEDICATIONS: Propofol 250 mg IV and Monitored anesthesia care  DESCRIPTION OF PROCEDURE:   After the risks benefits and alternatives of the procedure were thoroughly explained, informed consent was obtained.  The digital rectal exam revealed no abnormalities of the rectum, revealed no prostatic nodules, and revealed the prostate was not enlarged.   The LB [id number] endoscope was introduced through the anus and advanced to the cecum, which was identified by both the appendix and ileocecal valve. No adverse events experienced.   The quality of the prep was fair.  adequate (Prepopik was used)  The instrument was then slowly withdrawn as the colon was fully examined. Estimated blood loss is zero unless otherwise noted in this procedure report.   COLON FINDINGS: Two sessile polyps ranging from 3 to 32mm in size were found in the transverse colon and sigmoid colon. Polypectomies were performed with a cold snare.  The resection was complete, the polyp tissue was completely retrieved and sent to histology.   The examination was otherwise normal.  Retroflexed views revealed no abnormalities. The time to cecum = 4.0 Withdrawal time = 9.6   The scope  was withdrawn and the procedure completed. COMPLICATIONS: There were no immediate complications.  ENDOSCOPIC IMPRESSION: 1.   Two sessile polyps ranging from 3 to 30mm in size were found in the transverse colon and sigmoid colon; polypectomies were performed with a cold snare 2.   The examination was otherwise normal fair-adequate prep - lowered sensitivity for flat lesions  RECOMMENDATIONS: 1.  Timing of repeat colonoscopy will be determined by pathology findings. He has hx adenomas going back to 2001, last were 4 diminutive 2013 2.  He had nausea and vomiting wih the prep (tx ondansetron and IVF)and has had problems with at least severe nausea in past - may need to consider alternative testing/surveillance - non-invasive perhaps.  eSigned:  Gatha Mayer, MD, [facility] 05/21/2015 9:53 AM   cc: Janie Morning, MD and The Patient

## 2015-05-21 NOTE — Telephone Encounter (Signed)
I spoke to her and oked the Zofran He remains nauseous and has had just slight po intake. I advised regular sips clears and try gatorade. No chest pain or sob or cardiac sxs. Instructed if fails to improve ED evaluation and treatment would be needed.

## 2015-05-21 NOTE — Progress Notes (Signed)
To PACU. Awake and Alert, VSS, Pleased with MAC, Report to RN

## 2015-05-21 NOTE — Progress Notes (Signed)
Water given, increased coughing and white phlegm, no distress, vss Will cont to monitor 2 liters total nss given

## 2015-05-21 NOTE — Progress Notes (Signed)
Pt arrived to admitting actively vomiting dark brown liquid.  He is pale and shaking.  Spoke with Dr Carlean Purl. Orders received to start IV and give 8mg  of Zofran IV along with 1 liter of fluid.  Pt states that he feels better after the 1st dose of Zofran.  Will continue to monitor.

## 2015-05-21 NOTE — Patient Instructions (Addendum)
Two tiny polyps today.  The prep was just "ok".   Sorry you got so sick this time.  I will let you know pathology results and when to have another routine colonoscopy/test by mail.  I appreciate the opportunity to care for you. Gatha Mayer, MD, FACG  YOU HAD AN ENDOSCOPIC PROCEDURE TODAY AT [facility]:   Refer to the procedure report that was given to you for any specific questions about what was found during the examination.  If the procedure report does not answer your questions, please call your gastroenterologist to clarify.  If you requested that your care partner not be given the details of your procedure findings, then the procedure report has been included in a sealed envelope for you to review at your convenience later.  YOU SHOULD EXPECT: Some feelings of bloating in the abdomen. Passage of more gas than usual.  Walking can help get rid of the air that was put into your GI tract during the procedure and reduce the bloating. If you had a lower endoscopy (such as a colonoscopy or flexible sigmoidoscopy) you may notice spotting of blood in your stool or on the toilet paper. If you underwent a bowel prep for your procedure, you may not have a normal bowel movement for a few days.  Please Note:  You might notice some irritation and congestion in your nose or some drainage.  This is from the oxygen used during your procedure.  There is no need for concern and it should clear up in a day or so.  SYMPTOMS TO REPORT IMMEDIATELY:   Following lower endoscopy (colonoscopy or flexible sigmoidoscopy):  Excessive amounts of blood in the stool  Significant tenderness or worsening of abdominal pains  Swelling of the abdomen that is new, acute  Fever of 100F or higher   For urgent or emergent issues, a gastroenterologist can be reached at any hour by calling [phone].   DIET: Your first meal following the procedure should be a small meal and then it is ok  to progress to your normal diet. Heavy or fried foods are harder to digest and may make you feel nauseous or bloated.  Likewise, meals heavy in dairy and vegetables can increase bloating.  Drink plenty of fluids but you should avoid alcoholic beverages for 24 hours.  ACTIVITY:  You should plan to take it easy for the rest of today and you should NOT DRIVE or use heavy machinery until tomorrow (because of the sedation medicines used during the test).    FOLLOW UP: Our staff will call the number listed on your records the next business day following your procedure to check on you and address any questions or concerns that you may have regarding the information given to you following your procedure. If we do not reach you, we will leave a message.  However, if you are feeling well and you are not experiencing any problems, there is no need to return our call.  We will assume that you have returned to your regular daily activities without incident.  If any biopsies were taken you will be contacted by phone or by letter within the next 1-3 weeks.  Please call us at [phone] if you have not heard about the biopsies in 3 weeks.    SIGNATURES/CONFIDENTIALITY: You and/or your care partner have signed paperwork which will be entered into your electronic medical record.  These signatures attest to the fact that that the information above  on your After Visit Summary has been reviewed and is understood.  Full responsibility of the confidentiality of this discharge information lies with you and/or your care-partner.

## 2015-05-22 ENCOUNTER — Observation Stay (HOSPITAL_COMMUNITY)
Admission: EM | Admit: 2015-05-22 | Discharge: 2015-05-22 | Disposition: A | Discharge status: Home / Self Care | Payer: Medicare Other | Attending: Internal Medicine | Admitting: Internal Medicine

## 2015-05-22 ENCOUNTER — Telehealth: Payer: Self-pay

## 2015-05-22 ENCOUNTER — Emergency Department (HOSPITAL_COMMUNITY): Payer: Medicare Other

## 2015-05-22 ENCOUNTER — Observation Stay (HOSPITAL_COMMUNITY): Payer: Medicare Other

## 2015-05-22 DIAGNOSIS — Z79899 Other long term (current) drug therapy: Secondary | ICD-10-CM | POA: Diagnosis not present

## 2015-05-22 DIAGNOSIS — R1110 Vomiting, unspecified: Secondary | ICD-10-CM

## 2015-05-22 DIAGNOSIS — D72829 Elevated white blood cell count, unspecified: Secondary | ICD-10-CM | POA: Insufficient documentation

## 2015-05-22 DIAGNOSIS — F17210 Nicotine dependence, cigarettes, uncomplicated: Secondary | ICD-10-CM | POA: Insufficient documentation

## 2015-05-22 DIAGNOSIS — E785 Hyperlipidemia, unspecified: Secondary | ICD-10-CM | POA: Insufficient documentation

## 2015-05-22 DIAGNOSIS — Z9049 Acquired absence of other specified parts of digestive tract: Secondary | ICD-10-CM | POA: Diagnosis not present

## 2015-05-22 DIAGNOSIS — K2970 Gastritis, unspecified, without bleeding: Secondary | ICD-10-CM | POA: Diagnosis not present

## 2015-05-22 DIAGNOSIS — R197 Diarrhea, unspecified: Secondary | ICD-10-CM | POA: Diagnosis not present

## 2015-05-22 DIAGNOSIS — Z7982 Long term (current) use of aspirin: Secondary | ICD-10-CM | POA: Diagnosis not present

## 2015-05-22 DIAGNOSIS — K769 Liver disease, unspecified: Secondary | ICD-10-CM | POA: Diagnosis not present

## 2015-05-22 DIAGNOSIS — R112 Nausea with vomiting, unspecified: Secondary | ICD-10-CM | POA: Diagnosis not present

## 2015-05-22 DIAGNOSIS — N329 Bladder disorder, unspecified: Secondary | ICD-10-CM | POA: Insufficient documentation

## 2015-05-22 LAB — I-STAT CHEM 8, ED
BUN: 19 mg/dL (ref 6–20)
CALCIUM ION: 1.04 mmol/L — AB (ref 1.13–1.30)
CREATININE: 1 mg/dL (ref 0.61–1.24)
Chloride: 100 mmol/L — ABNORMAL LOW (ref 101–111)
Glucose, Bld: 143 mg/dL — ABNORMAL HIGH (ref 65–99)
HEMATOCRIT: 47 % (ref 39.0–52.0)
Hemoglobin: 16 g/dL (ref 13.0–17.0)
Potassium: 3.5 mmol/L (ref 3.5–5.1)
SODIUM: 141 mmol/L (ref 135–145)
TCO2: 28 mmol/L (ref 0–100)

## 2015-05-22 LAB — CBC WITH DIFFERENTIAL/PLATELET
BASOS ABS: 0 10*3/uL (ref 0.0–0.1)
BASOS PCT: 0 %
EOS PCT: 0 %
Eosinophils Absolute: 0 10*3/uL (ref 0.0–0.7)
HCT: 44.1 % (ref 39.0–52.0)
Hemoglobin: 14.8 g/dL (ref 13.0–17.0)
Lymphocytes Relative: 15 %
Lymphs Abs: 2.7 10*3/uL (ref 0.7–4.0)
MCH: 30.3 pg (ref 26.0–34.0)
MCHC: 33.6 g/dL (ref 30.0–36.0)
MCV: 90.2 fL (ref 78.0–100.0)
MONO ABS: 1.1 10*3/uL — AB (ref 0.1–1.0)
Monocytes Relative: 6 %
Neutro Abs: 13.6 10*3/uL — ABNORMAL HIGH (ref 1.7–7.7)
Neutrophils Relative %: 79 %
PLATELETS: 210 10*3/uL (ref 150–400)
RBC: 4.89 MIL/uL (ref 4.22–5.81)
RDW: 13.5 % (ref 11.5–15.5)
WBC: 17.4 10*3/uL — ABNORMAL HIGH (ref 4.0–10.5)

## 2015-05-22 LAB — BASIC METABOLIC PANEL
ANION GAP: 8 (ref 5–15)
BUN: 18 mg/dL (ref 6–20)
CALCIUM: 8.8 mg/dL — AB (ref 8.9–10.3)
CO2: 28 mmol/L (ref 22–32)
Chloride: 106 mmol/L (ref 101–111)
Creatinine, Ser: 1.17 mg/dL (ref 0.61–1.24)
Glucose, Bld: 146 mg/dL — ABNORMAL HIGH (ref 65–99)
Potassium: 3.7 mmol/L (ref 3.5–5.1)
SODIUM: 142 mmol/L (ref 135–145)

## 2015-05-22 LAB — MAGNESIUM: Magnesium: 2.1 mg/dL (ref 1.7–2.4)

## 2015-05-22 MED ORDER — ONDANSETRON HCL 4 MG/2ML IJ SOLN
4.0000 mg | Freq: Once | INTRAMUSCULAR | Status: AC
  Administered 2015-05-22: 4 mg via INTRAVENOUS
  Filled 2015-05-22: qty 2

## 2015-05-22 MED ORDER — SODIUM CHLORIDE 0.9 % IV BOLUS (SEPSIS)
1000.0000 mL | Freq: Once | INTRAVENOUS | Status: AC
  Administered 2015-05-22: 1000 mL via INTRAVENOUS

## 2015-05-22 MED ORDER — IOHEXOL 300 MG/ML  SOLN
100.0000 mL | Freq: Once | INTRAMUSCULAR | Status: AC | PRN
  Administered 2015-05-22: 100 mL via INTRAVENOUS

## 2015-05-22 MED ORDER — METOCLOPRAMIDE HCL 10 MG PO TABS: 10.0000 mg | ORAL_TABLET | Freq: Four times a day (QID) | ORAL | Status: DC

## 2015-05-22 MED ORDER — DIPHENHYDRAMINE HCL 50 MG/ML IJ SOLN
25.0000 mg | Freq: Once | INTRAMUSCULAR | Status: AC
  Administered 2015-05-22: 25 mg via INTRAVENOUS
  Filled 2015-05-22: qty 1

## 2015-05-22 MED ORDER — ALUM & MAG HYDROXIDE-SIMETH 200-200-20 MG/5ML PO SUSP
15.0000 mL | Freq: Once | ORAL | Status: DC
  Filled 2015-05-22: qty 30

## 2015-05-22 MED ORDER — SODIUM CHLORIDE 0.9 % IV SOLN
Freq: Once | INTRAVENOUS | Status: AC
  Administered 2015-05-22: 19:00:00 via INTRAVENOUS

## 2015-05-22 MED ORDER — PROCHLORPERAZINE EDISYLATE 5 MG/ML IJ SOLN
5.0000 mg | Freq: Once | INTRAMUSCULAR | Status: AC
  Administered 2015-05-22: 5 mg via INTRAVENOUS
  Filled 2015-05-22: qty 2

## 2015-05-22 NOTE — Telephone Encounter (Signed)
  Follow up Call-  Call back number 05/21/2015  Post procedure Call Back phone  # [phone]  Permission to leave phone message Yes     Patient questions:  Do you have a fever, pain , or abdominal swelling? No. Pain Score  0 *  Have you tolerated food without any problems? No.  Have you been able to return to your normal activities? No.  Do you have any questions about your discharge instructions: Diet   No. Medications  No. Follow up visit  No.  Do you have questions or concerns about your Care? No.  Actions: * If pain score is 4 or above: No action needed, pain <4. Wife stated patient weak and tolerating a little water. Made him a smoothy this morning. Will continue to attempt to feed him today. Instructed to call back if needed.

## 2015-05-22 NOTE — Discharge Instructions (Signed)
Follow up with your doctor tomorrow.  Nausea and Vomiting Nausea is a sick feeling that often comes before throwing up (vomiting). Vomiting is a reflex where stomach contents come out of your mouth. Vomiting can cause severe loss of body fluids (dehydration). Children and elderly adults can become dehydrated quickly, especially if they also have diarrhea. Nausea and vomiting are symptoms of a condition or disease. It is important to find the cause of your symptoms. CAUSES   Direct irritation of the stomach lining. This irritation can result from increased acid production (gastroesophageal reflux disease), infection, food poisoning, taking certain medicines (such as nonsteroidal anti-inflammatory drugs), alcohol use, or tobacco use.  Signals from the brain.These signals could be caused by a headache, heat exposure, an inner ear disturbance, increased pressure in the brain from injury, infection, a tumor, or a concussion, pain, emotional stimulus, or metabolic problems.  An obstruction in the gastrointestinal tract (bowel obstruction).  Illnesses such as diabetes, hepatitis, gallbladder problems, appendicitis, kidney problems, cancer, sepsis, atypical symptoms of a heart attack, or eating disorders.  Medical treatments such as chemotherapy and radiation.  Receiving medicine that makes you sleep (general anesthetic) during surgery. DIAGNOSIS Your caregiver may ask for tests to be done if the problems do not improve after a few days. Tests may also be done if symptoms are severe or if the reason for the nausea and vomiting is not clear. Tests may include:  Urine tests.  Blood tests.  Stool tests.  Cultures (to look for evidence of infection).  X-rays or other imaging studies. Test results can help your caregiver make decisions about treatment or the need for additional tests. TREATMENT You need to stay well hydrated. Drink frequently but in small amounts.You may wish to drink water,  sports drinks, clear broth, or eat frozen ice pops or gelatin dessert to help stay hydrated.When you eat, eating slowly may help prevent nausea.There are also some antinausea medicines that may help prevent nausea. HOME CARE INSTRUCTIONS   Take all medicine as directed by your caregiver.  If you do not have an appetite, do not force yourself to eat. However, you must continue to drink fluids.  If you have an appetite, eat a normal diet unless your caregiver tells you differently.  Eat a variety of complex carbohydrates (rice, wheat, potatoes, bread), lean meats, yogurt, fruits, and vegetables.  Avoid high-fat foods because they are more difficult to digest.  Drink enough water and fluids to keep your urine clear or pale yellow.  If you are dehydrated, ask your caregiver for specific rehydration instructions. Signs of dehydration may include:  Severe thirst.  Dry lips and mouth.  Dizziness.  Dark urine.  Decreasing urine frequency and amount.  Confusion.  Rapid breathing or pulse. SEEK IMMEDIATE MEDICAL CARE IF:   You have blood or brown flecks (like coffee grounds) in your vomit.  You have black or bloody stools.  You have a severe headache or stiff neck.  You are confused.  You have severe abdominal pain.  You have chest pain or trouble breathing.  You do not urinate at least once every 8 hours.  You develop cold or clammy skin.  You continue to vomit for longer than 24 to 48 hours.  You have a fever. MAKE SURE YOU:   Understand these instructions.  Will watch your condition.  Will get help right away if you are not doing well or get worse.   This information is not intended to replace advice given to you  by your health care provider. Make sure you discuss any questions you have with your health care provider.   Document Released: 06/01/2005 Document Revised: 08/24/2011 Document Reviewed: 10/29/2010 Elsevier Interactive Patient Education [employer].

## 2015-05-22 NOTE — ED Provider Notes (Signed)
CSN: [id number]     Arrival date & time 05/22/15  1405 History   First MD Initiated Contact with Patient 05/22/15 1512     Chief Complaint  Patient presents with  . Nausea  . Emesis     (Consider location/radiation/quality/duration/timing/severity/associated sxs/prior Treatment) Patient is a 69 y.o. male presenting with vomiting. The history is provided by the patient.  Emesis Severity:  Moderate Duration:  4 days Timing:  Constant Quality:  Stomach contents Able to tolerate:  Liquids Progression:  Worsening Chronicity:  New Recent urination:  Normal Relieved by:  Antiemetics Worsened by:  Nothing tried Ineffective treatments:  None tried Associated symptoms: no abdominal pain, no arthralgias, no chills, no diarrhea, no headaches and no myalgias     69 yo M with a chief complaint of nausea and vomiting. Patient had a colonoscopy done on Tuesday. Family states he's not do well with the colonoscopy prep. Patient had multiple episodes of vomiting since Monday. At 2 L of fluid and Zofran with improvement prior to colonoscopy. Patient has not vomited since the nail to keep down mild amounts of liquids however continues to be extremely nauseated has been weak at home. Family denies fevers or chills. Denies abdominal pain. Denies chest pain shortness of breath.  Past Medical History  Diagnosis Date  . Hyperlipidemia   . Colon polyps   . History of blood transfusion     age 31  . Nausea and vomiting - with colonoscopy preps 05/21/2015   Past Surgical History  Procedure Laterality Date  . Hernia repair    . Hirschsprung's resection  1961  . Appendectomy    . Cholecystectomy    . Colonoscopy     Family History  Problem Relation Age of Onset  . Heart disease Father   . Colon cancer Neg Hx   . Stomach cancer Neg Hx   . Colon polyps Neg Hx   . Diabetes Neg Hx   . Kidney disease Neg Hx    Social History  Substance Use Topics  . Smoking status: Current Every Day Smoker -- 0.25  packs/day    Types: Cigarettes, Cigars  . Smokeless tobacco: Never Used     Comment: smokes 2 to 3 cigarettes daily  . Alcohol Use: 0.6 oz/week    1 Glasses of wine per week     Comment: Occassionally    Review of Systems  Constitutional: Negative for fever and chills.  HENT: Negative for congestion and facial swelling.   Eyes: Negative for discharge and visual disturbance.  Respiratory: Negative for shortness of breath.   Cardiovascular: Negative for chest pain and palpitations.  Gastrointestinal: Positive for nausea and vomiting. Negative for abdominal pain and diarrhea.  Musculoskeletal: Negative for myalgias and arthralgias.  Skin: Negative for color change and rash.  Neurological: Negative for tremors, syncope and headaches.  Psychiatric/Behavioral: Negative for confusion and dysphoric mood.      Allergies  Moviprep and Penicillins  Home Medications   Prior to Admission medications   Medication Sig Start Date End Date Taking? Authorizing Provider  aspirin 81 MG tablet Take 81 mg by mouth daily.     Yes Historical Provider, MD  beta carotene w/minerals (OCUVITE) tablet Take 1 tablet by mouth daily.    Yes Historical Provider, MD  fenofibrate micronized (LOFIBRA) 134 MG capsule Take 134 mg by mouth daily before breakfast.     Yes Historical Provider, MD  fish oil-omega-3 fatty acids 1000 MG capsule Take 2 g by mouth daily.  Yes Historical Provider, MD  MULTIPLE VITAMINS-MINERALS PO Take 1 tablet by mouth daily.   Yes Historical Provider, MD  pravastatin (PRAVACHOL) 40 MG tablet Take 40 mg by mouth daily.     Yes Historical Provider, MD  vitamin C (ASCORBIC ACID) 500 MG tablet Take 500 mg by mouth daily.     Yes Historical Provider, MD  vitamin E 400 UNIT capsule Take 400 Units by mouth daily.     Yes Historical Provider, MD  metoCLOPramide (REGLAN) 10 MG tablet Take 1 tablet (10 mg total) by mouth every 6 (six) hours. 05/22/15   Deno Etienne, DO   BP 144/66 mmHg  Pulse 64   Temp(Src) 98.1 F (36.7 C) (Oral)  Resp 18  SpO2 95% Physical Exam  Constitutional: He is oriented to person, place, and time. He appears well-developed and well-nourished.  HENT:  Head: Normocephalic and atraumatic.  Eyes: EOM are normal. Pupils are equal, round, and reactive to light.  Neck: Normal range of motion. Neck supple. No JVD present.  Cardiovascular: Normal rate and regular rhythm.  Exam reveals no gallop and no friction rub.   No murmur heard. Pulmonary/Chest: No respiratory distress. He has no wheezes.  Abdominal: He exhibits no distension. There is no tenderness. There is no rebound and no guarding.  Musculoskeletal: Normal range of motion.  Neurological: He is alert and oriented to person, place, and time.  Skin: No rash noted. No pallor.  Psychiatric: He has a normal mood and affect. His behavior is normal.  Nursing note and vitals reviewed.   ED Course  Procedures (including critical care time) Labs Review Labs Reviewed  CBC WITH DIFFERENTIAL/PLATELET - Abnormal; Notable for the following:    WBC 17.4 (*)    Neutro Abs 13.6 (*)    Monocytes Absolute 1.1 (*)    All other components within normal limits  BASIC METABOLIC PANEL - Abnormal; Notable for the following:    Glucose, Bld 146 (*)    Calcium 8.8 (*)    All other components within normal limits  I-STAT CHEM 8, ED - Abnormal; Notable for the following:    Chloride 100 (*)    Glucose, Bld 143 (*)    Calcium, Ion 1.04 (*)    All other components within normal limits  MAGNESIUM  I-STAT TROPOININ, ED    Imaging Review Ct Abdomen Pelvis W Contrast  05/22/2015  CLINICAL DATA:  Initial encounter for 2 day history of nausea and vomiting. EXAM: CT ABDOMEN AND PELVIS WITH CONTRAST TECHNIQUE: Multidetector CT imaging of the abdomen and pelvis was performed using the standard protocol following bolus administration of intravenous contrast. CONTRAST:  16mL OMNIPAQUE IOHEXOL 300 MG/ML  SOLN COMPARISON:  None.  FINDINGS: Lower chest: Compressive atelectasis noted in the dependent lung bases. Hepatobiliary: 1.9 cm low-density lesion identified in the subcapsular aspect of the posterior right liver. Similar 15 mm lesion identified in the medial segment left liver. 5 mm low-density lesion identified in the central right liver. No evidence for enhancing liver lesion. Gallbladder is surgically absent. No intrahepatic or extrahepatic biliary dilation. Pancreas: No focal mass lesion. No dilatation of the main duct. No intraparenchymal cyst. No peripancreatic edema. Spleen: No splenomegaly. No focal mass lesion. Adrenals/Urinary Tract: No adrenal nodule or mass. All kidneys are unremarkable. No evidence for hydroureteronephrosis. Small sessile lesion is seen along the inferior bladder wall (image 90 series 2 and image 61 series 5). This may be related to underlying prostatic hypertrophy, but urothelial lesion is a concern. Stomach/Bowel:  Stomach is nondistended. No gastric wall thickening. No evidence of outlet obstruction. Duodenum is normally positioned as is the ligament of Treitz. No small bowel wall thickening. No small bowel dilatation. The terminal ileum is normal. The appendix is not visualized, but there is no edema or inflammation in the region of the cecum. No gross colonic mass. No colonic wall thickening. No substantial diverticular change. Vascular/Lymphatic: There is abdominal aortic atherosclerosis without aneurysm. There is no gastrohepatic or hepatoduodenal ligament lymphadenopathy. No intraperitoneal or retroperitoneal lymphadenopy. No pelvic sidewall lymphadenopathy. Reproductive: The prostate gland and seminal vesicles have normal imaging features. Other: No intraperitoneal free fluid. Musculoskeletal: Sclerotic focus in the posterior left acetabulum likely a bone island. Lucency in the left iliac crest has well-defined margins and a benign appearance. Bilateral pars interarticularis defects seen at L5. Bone  windows reveal no worrisome lytic or sclerotic osseous lesions. IMPRESSION: 1. No CT findings to explain the patient's history of nausea and vomiting. 2. Soft tissue polypoid lesion along the inferior bladder wall. Urothelial neoplasm is a concern. 3. Multiple low-density liver lesions, likely hepatic cysts. Electronically Signed   By: Misty Stanley M.D.   On: 05/22/2015 18:21   I have personally reviewed and evaluated these images and lab results as part of my medical decision-making.   EKG Interpretation None      MDM   Final diagnoses:  Intractable vomiting with nausea, vomiting of unspecified type    69 yo M with a chief complaint of vomiting after doing a bowel prep. Family is here today concerned about possible electrolyte imbalance. We'll check electrolytes given a bolus of IV fluids give IV Zofran. No noted abdominal pain on exam. ECG with no comparision, no cp, sob.  Feel chest pathology unlikely.  No hx of sudden chest pain post vomiting, afebrile.   Markedly elevated leukocytosis. Electrolytes unremarkable. We'll obtain a CT scan of the abdomen and pelvis w/ contrast. Continued nausea on reassessment, will try compazine.   Patient still having significant nausea after 2 doses of Zofran and Compazine. CT scan abdomen pelvis negative for acute pathology. Patient still unable to eat or drink anything for me. Vital signs continued to be stable. Will admit. Started on IV fluid infusion.   Patient now feeling like he will go home. Discussed with him that this may be a side effect of the Compazine. Patient still feels like he would rather get off of the stretcher family is concerned because he still waiting for at least an hour for bed. Discussed with family that there is no definitive time for transportation up stairs. He likes for outpatient management. Follow with her PCP and GI doctor.   I have discussed the diagnosis/risks/treatment options with the patient and family and believe the  pt to be eligible for discharge home to follow-up with PCP/GI. We also discussed returning to the ED immediately if new or worsening sx occur. We discussed the sx which are most concerning (e.g., sudden worsening pain, fever, inability to tolerate by mouth) that necessitate immediate return. Medications administered to the patient during their visit and any new prescriptions provided to the patient are listed below.  Medications given during this visit Medications  alum & mag hydroxide-simeth (MAALOX/MYLANTA) 200-200-20 MG/5ML suspension 15 mL (15 mLs Oral Not Given 05/22/15 1728)  sodium chloride 0.9 % bolus 1,000 mL (0 mLs Intravenous Stopped 05/22/15 1847)  ondansetron (ZOFRAN) injection 4 mg (4 mg Intravenous Given 05/22/15 1605)  prochlorperazine (COMPAZINE) injection 5 mg (5 mg Intravenous Given 05/22/15  1726)  diphenhydrAMINE (BENADRYL) injection 25 mg (25 mg Intravenous Given 05/22/15 1728)  iohexol (OMNIPAQUE) 300 MG/ML solution 100 mL (100 mLs Intravenous Contrast Given 05/22/15 1750)  0.9 %  sodium chloride infusion ( Intravenous Stopped 05/22/15 2021)    Discharge Medication List as of 05/22/2015  7:37 PM    START taking these medications   Details  metoCLOPramide (REGLAN) 10 MG tablet Take 1 tablet (10 mg total) by mouth every 6 (six) hours., Starting 05/22/2015, Until Discontinued, Print        The patient appears reasonably screen and/or stabilized for discharge and I doubt any other medical condition or other [facility] requiring further screening, evaluation, or treatment in the ED at this time prior to discharge.    Deno Etienne, DO 05/22/15 2313

## 2015-05-22 NOTE — ED Notes (Signed)
His wife states Dr. Carlean Purl performed colonoscopy on pt. Yesterday and she requests our EDP notify him of pt. Condition/findings.

## 2015-05-22 NOTE — ED Notes (Signed)
Pt states he took a bowel prep on Monday at [facility] and on Tuesday was having nausea and vomiting. Pt states he had a similar situation occur with a different bowel prep. Pt is alert and oriented x4 and in no acute distress.

## 2015-05-22 NOTE — ED Notes (Signed)
Bed: [id number] Expected date:  Expected time:  Means of arrival:  Comments: EMS- 69yo M, n/v/weakness

## 2015-05-23 ENCOUNTER — Telehealth: Payer: Self-pay | Admitting: Internal Medicine

## 2015-05-23 NOTE — Telephone Encounter (Signed)
WBC elevation is non-specific Overall studies looked good except possible bladder abnormality that will need urology evaluation his PCP can arrange  I think he had a bad side effect from prep-o-pik and hope its all over soon.

## 2015-05-23 NOTE — Telephone Encounter (Signed)
patient wife states that patient went to the ED because he was not doing good since colonoscopy. she states that patient is feeling better and is taking fluids but his white blood count is up. patient wife states that pt has appt tomorrow at Dr. Shon Baton office to check blood count.

## 2015-05-23 NOTE — Telephone Encounter (Signed)
Dr. Carlean Purl see ER notes.  His WBC in ER was 17.4.  Please review notes and advise.

## 2015-05-23 NOTE — Telephone Encounter (Signed)
Patient's wife notified I will send a copy of this and the ER note to Dr. Philip Aspen

## 2015-05-24 DIAGNOSIS — Z6830 Body mass index (BMI) 30.0-30.9, adult: Secondary | ICD-10-CM | POA: Diagnosis not present

## 2015-05-24 DIAGNOSIS — D72829 Elevated white blood cell count, unspecified: Secondary | ICD-10-CM | POA: Diagnosis not present

## 2015-05-24 DIAGNOSIS — R110 Nausea: Secondary | ICD-10-CM | POA: Diagnosis not present

## 2015-05-27 ENCOUNTER — Encounter: Payer: Self-pay | Admitting: Internal Medicine

## 2015-05-27 NOTE — Progress Notes (Signed)
Quick Note:  Sessile serrated polyp and tubular adenoma - both diminutive Recall office visit 5 yrs ______

## 2015-05-30 DIAGNOSIS — D72829 Elevated white blood cell count, unspecified: Secondary | ICD-10-CM | POA: Diagnosis not present

## 2015-05-30 DIAGNOSIS — R110 Nausea: Secondary | ICD-10-CM | POA: Diagnosis not present

## 2015-05-30 DIAGNOSIS — Z6830 Body mass index (BMI) 30.0-30.9, adult: Secondary | ICD-10-CM | POA: Diagnosis not present

## 2015-07-05 DIAGNOSIS — H3530 Unspecified macular degeneration: Secondary | ICD-10-CM | POA: Diagnosis not present

## 2015-07-05 DIAGNOSIS — H2513 Age-related nuclear cataract, bilateral: Secondary | ICD-10-CM | POA: Diagnosis not present

## 2015-09-13 NOTE — Telephone Encounter (Signed)
See previous encounter

## 2016-01-06 DIAGNOSIS — H1013 Acute atopic conjunctivitis, bilateral: Secondary | ICD-10-CM | POA: Diagnosis not present

## 2016-01-13 DIAGNOSIS — Z125 Encounter for screening for malignant neoplasm of prostate: Secondary | ICD-10-CM | POA: Diagnosis not present

## 2016-01-13 DIAGNOSIS — E782 Mixed hyperlipidemia: Secondary | ICD-10-CM | POA: Diagnosis not present

## 2016-01-13 DIAGNOSIS — D72829 Elevated white blood cell count, unspecified: Secondary | ICD-10-CM | POA: Diagnosis not present

## 2016-01-13 DIAGNOSIS — R7301 Impaired fasting glucose: Secondary | ICD-10-CM | POA: Diagnosis not present

## 2016-01-16 DIAGNOSIS — Z1212 Encounter for screening for malignant neoplasm of rectum: Secondary | ICD-10-CM | POA: Diagnosis not present

## 2016-01-20 DIAGNOSIS — F17210 Nicotine dependence, cigarettes, uncomplicated: Secondary | ICD-10-CM | POA: Diagnosis not present

## 2016-01-20 DIAGNOSIS — N3289 Other specified disorders of bladder: Secondary | ICD-10-CM | POA: Diagnosis not present

## 2016-01-20 DIAGNOSIS — Z0000 Encounter for general adult medical examination without abnormal findings: Secondary | ICD-10-CM | POA: Diagnosis not present

## 2016-01-20 DIAGNOSIS — E782 Mixed hyperlipidemia: Secondary | ICD-10-CM | POA: Diagnosis not present

## 2016-01-20 DIAGNOSIS — R7301 Impaired fasting glucose: Secondary | ICD-10-CM | POA: Diagnosis not present

## 2016-01-20 DIAGNOSIS — Z1389 Encounter for screening for other disorder: Secondary | ICD-10-CM | POA: Diagnosis not present

## 2016-01-20 DIAGNOSIS — Z6831 Body mass index (BMI) 31.0-31.9, adult: Secondary | ICD-10-CM | POA: Diagnosis not present

## 2016-01-30 DIAGNOSIS — H2513 Age-related nuclear cataract, bilateral: Secondary | ICD-10-CM | POA: Diagnosis not present

## 2016-01-30 DIAGNOSIS — H353212 Exudative age-related macular degeneration, right eye, with inactive choroidal neovascularization: Secondary | ICD-10-CM | POA: Diagnosis not present

## 2016-01-30 DIAGNOSIS — H353290 Exudative age-related macular degeneration, unspecified eye, stage unspecified: Secondary | ICD-10-CM | POA: Diagnosis not present

## 2016-01-30 DIAGNOSIS — H43812 Vitreous degeneration, left eye: Secondary | ICD-10-CM | POA: Insufficient documentation

## 2016-01-30 DIAGNOSIS — H353121 Nonexudative age-related macular degeneration, left eye, early dry stage: Secondary | ICD-10-CM | POA: Diagnosis not present

## 2016-02-04 DIAGNOSIS — D414 Neoplasm of uncertain behavior of bladder: Secondary | ICD-10-CM | POA: Diagnosis not present

## 2016-04-14 DIAGNOSIS — Z23 Encounter for immunization: Secondary | ICD-10-CM | POA: Diagnosis not present

## 2016-04-15 DIAGNOSIS — L821 Other seborrheic keratosis: Secondary | ICD-10-CM | POA: Diagnosis not present

## 2016-04-15 DIAGNOSIS — Z86018 Personal history of other benign neoplasm: Secondary | ICD-10-CM | POA: Diagnosis not present

## 2016-04-15 DIAGNOSIS — Z23 Encounter for immunization: Secondary | ICD-10-CM | POA: Diagnosis not present

## 2016-04-15 DIAGNOSIS — D1801 Hemangioma of skin and subcutaneous tissue: Secondary | ICD-10-CM | POA: Diagnosis not present

## 2016-04-15 DIAGNOSIS — L814 Other melanin hyperpigmentation: Secondary | ICD-10-CM | POA: Diagnosis not present

## 2016-04-15 DIAGNOSIS — D225 Melanocytic nevi of trunk: Secondary | ICD-10-CM | POA: Diagnosis not present

## 2016-05-14 ENCOUNTER — Encounter: Payer: Self-pay | Admitting: Podiatry

## 2016-05-14 ENCOUNTER — Ambulatory Visit (INDEPENDENT_AMBULATORY_CARE_PROVIDER_SITE_OTHER): Payer: Medicare Other | Admitting: Podiatry

## 2016-05-14 VITALS — BP 119/68 | HR 60 | Resp 16 | Ht 72.0 in | Wt 218.0 lb

## 2016-05-14 DIAGNOSIS — Q6650 Congenital pes planus, unspecified foot: Secondary | ICD-10-CM

## 2016-05-14 DIAGNOSIS — M21619 Bunion of unspecified foot: Secondary | ICD-10-CM | POA: Diagnosis not present

## 2016-05-14 DIAGNOSIS — L84 Corns and callosities: Secondary | ICD-10-CM | POA: Diagnosis not present

## 2016-05-14 NOTE — Progress Notes (Signed)
   Subjective:    Patient ID: Cory Walls, male    DOB: Dec 19, 1945, 70 y.o.   MRN: [id number]  HPI Chief Complaint  Patient presents with  . Callouses    Right foot; plantar forefoot-below great toe; Great toe-medial; pt stated, "has had callous on and off for past 15 yrs"      Review of Systems  All other systems reviewed and are negative.      Objective:   Physical Exam        Assessment & Plan:

## 2016-05-14 NOTE — Progress Notes (Signed)
Subjective:     Patient ID: Cory Walls, male   DOB: 02/06/1946, 70 y.o.   MRN: [id number]  HPI patient presents stating he has a callus on the plantar first metatarsal and right hallux and has had history of bunion surgery right with severe flatfoot deformity of both feet   Review of Systems  All other systems reviewed and are negative.      Objective:   Physical Exam  Constitutional: He is oriented to person, place, and time.  Cardiovascular: Intact distal pulses.   Musculoskeletal: Normal range of motion.  Neurological: He is oriented to person, place, and time.  Skin: Skin is warm.  Nursing note and vitals reviewed.  neurovascular status intact muscle strength adequate range of motion within normal limits with patient found to have plantar callus on the metatarsal and right hallux that are very painful when pressed and makes shoe gear difficult with prolonged ambulation. Does have severe flatfoot deformity but does not have postural pain at this time and was noted to have good digital perfusion and well oriented 3     Assessment:     Lanter keratotic lesion secondary to pressure with reactive skin formation and severe flatfoot deformity    Plan:     H&P conditions reviewed and debridement accomplished and discussed possibility for long-term orthotics and spent a great of time educating him on flatfoot deformity structural bunion deformity and orthotics

## 2016-05-19 DIAGNOSIS — Z23 Encounter for immunization: Secondary | ICD-10-CM | POA: Diagnosis not present

## 2016-07-10 DIAGNOSIS — H2513 Age-related nuclear cataract, bilateral: Secondary | ICD-10-CM | POA: Diagnosis not present

## 2016-08-04 DIAGNOSIS — Z6830 Body mass index (BMI) 30.0-30.9, adult: Secondary | ICD-10-CM | POA: Diagnosis not present

## 2016-08-04 DIAGNOSIS — E782 Mixed hyperlipidemia: Secondary | ICD-10-CM | POA: Diagnosis not present

## 2016-08-04 DIAGNOSIS — R7301 Impaired fasting glucose: Secondary | ICD-10-CM | POA: Diagnosis not present

## 2016-08-04 DIAGNOSIS — F17210 Nicotine dependence, cigarettes, uncomplicated: Secondary | ICD-10-CM | POA: Diagnosis not present

## 2016-08-04 DIAGNOSIS — Z1389 Encounter for screening for other disorder: Secondary | ICD-10-CM | POA: Diagnosis not present

## 2016-09-08 DIAGNOSIS — Z6830 Body mass index (BMI) 30.0-30.9, adult: Secondary | ICD-10-CM | POA: Diagnosis not present

## 2016-09-08 DIAGNOSIS — J0100 Acute maxillary sinusitis, unspecified: Secondary | ICD-10-CM | POA: Diagnosis not present

## 2016-09-08 DIAGNOSIS — R05 Cough: Secondary | ICD-10-CM | POA: Diagnosis not present

## 2016-12-03 ENCOUNTER — Ambulatory Visit (INDEPENDENT_AMBULATORY_CARE_PROVIDER_SITE_OTHER): Payer: Medicare Other | Admitting: Podiatry

## 2016-12-03 DIAGNOSIS — L84 Corns and callosities: Secondary | ICD-10-CM

## 2016-12-03 NOTE — Progress Notes (Signed)
Subjective:    Patient ID: Cory Walls, male   DOB: 71 y.o.   MRN: [id number]   HPI patient has painful callus sub-first metatarsal right    ROS      Objective:  Physical Exam neurovascular status intact with keratotic lesion sub-first metatarsal right hallux right that's painful when pressed     Assessment:    Lesion secondary to pressure with structural deformity     Plan:    Debridement accomplished with no iatrogenic bleeding noted

## 2017-01-14 DIAGNOSIS — Z125 Encounter for screening for malignant neoplasm of prostate: Secondary | ICD-10-CM | POA: Diagnosis not present

## 2017-01-14 DIAGNOSIS — N182 Chronic kidney disease, stage 2 (mild): Secondary | ICD-10-CM | POA: Diagnosis not present

## 2017-01-14 DIAGNOSIS — E782 Mixed hyperlipidemia: Secondary | ICD-10-CM | POA: Diagnosis not present

## 2017-01-14 DIAGNOSIS — R7301 Impaired fasting glucose: Secondary | ICD-10-CM | POA: Diagnosis not present

## 2017-01-19 DIAGNOSIS — Z1212 Encounter for screening for malignant neoplasm of rectum: Secondary | ICD-10-CM | POA: Diagnosis not present

## 2017-01-22 DIAGNOSIS — E782 Mixed hyperlipidemia: Secondary | ICD-10-CM | POA: Diagnosis not present

## 2017-01-22 DIAGNOSIS — R7301 Impaired fasting glucose: Secondary | ICD-10-CM | POA: Diagnosis not present

## 2017-01-22 DIAGNOSIS — K8020 Calculus of gallbladder without cholecystitis without obstruction: Secondary | ICD-10-CM | POA: Diagnosis not present

## 2017-01-22 DIAGNOSIS — F17210 Nicotine dependence, cigarettes, uncomplicated: Secondary | ICD-10-CM | POA: Diagnosis not present

## 2017-01-22 DIAGNOSIS — R030 Elevated blood-pressure reading, without diagnosis of hypertension: Secondary | ICD-10-CM | POA: Diagnosis not present

## 2017-01-22 DIAGNOSIS — Z1389 Encounter for screening for other disorder: Secondary | ICD-10-CM | POA: Diagnosis not present

## 2017-01-22 DIAGNOSIS — Z6829 Body mass index (BMI) 29.0-29.9, adult: Secondary | ICD-10-CM | POA: Diagnosis not present

## 2017-01-22 DIAGNOSIS — Z0000 Encounter for general adult medical examination without abnormal findings: Secondary | ICD-10-CM | POA: Diagnosis not present

## 2017-03-04 DIAGNOSIS — H353212 Exudative age-related macular degeneration, right eye, with inactive choroidal neovascularization: Secondary | ICD-10-CM | POA: Diagnosis not present

## 2017-03-04 DIAGNOSIS — H353121 Nonexudative age-related macular degeneration, left eye, early dry stage: Secondary | ICD-10-CM | POA: Diagnosis not present

## 2017-03-04 DIAGNOSIS — H43812 Vitreous degeneration, left eye: Secondary | ICD-10-CM | POA: Diagnosis not present

## 2017-03-04 DIAGNOSIS — H35363 Drusen (degenerative) of macula, bilateral: Secondary | ICD-10-CM | POA: Diagnosis not present

## 2017-03-04 DIAGNOSIS — H25813 Combined forms of age-related cataract, bilateral: Secondary | ICD-10-CM | POA: Diagnosis not present

## 2017-04-05 DIAGNOSIS — Z23 Encounter for immunization: Secondary | ICD-10-CM | POA: Diagnosis not present

## 2017-04-19 DIAGNOSIS — L729 Follicular cyst of the skin and subcutaneous tissue, unspecified: Secondary | ICD-10-CM | POA: Diagnosis not present

## 2017-04-26 DIAGNOSIS — D225 Melanocytic nevi of trunk: Secondary | ICD-10-CM | POA: Diagnosis not present

## 2017-04-26 DIAGNOSIS — L814 Other melanin hyperpigmentation: Secondary | ICD-10-CM | POA: Diagnosis not present

## 2017-04-26 DIAGNOSIS — D170 Benign lipomatous neoplasm of skin and subcutaneous tissue of head, face and neck: Secondary | ICD-10-CM | POA: Diagnosis not present

## 2017-04-26 DIAGNOSIS — Z23 Encounter for immunization: Secondary | ICD-10-CM | POA: Diagnosis not present

## 2017-04-26 DIAGNOSIS — D1801 Hemangioma of skin and subcutaneous tissue: Secondary | ICD-10-CM | POA: Diagnosis not present

## 2017-04-26 DIAGNOSIS — Z86018 Personal history of other benign neoplasm: Secondary | ICD-10-CM | POA: Diagnosis not present

## 2017-04-26 DIAGNOSIS — L821 Other seborrheic keratosis: Secondary | ICD-10-CM | POA: Diagnosis not present

## 2017-05-18 ENCOUNTER — Other Ambulatory Visit: Payer: Self-pay | Admitting: General Surgery

## 2017-05-18 DIAGNOSIS — D1739 Benign lipomatous neoplasm of skin and subcutaneous tissue of other sites: Secondary | ICD-10-CM | POA: Diagnosis not present

## 2017-05-18 DIAGNOSIS — D170 Benign lipomatous neoplasm of skin and subcutaneous tissue of head, face and neck: Secondary | ICD-10-CM | POA: Diagnosis not present

## 2017-07-14 DIAGNOSIS — H3530 Unspecified macular degeneration: Secondary | ICD-10-CM | POA: Diagnosis not present

## 2017-07-14 DIAGNOSIS — H2513 Age-related nuclear cataract, bilateral: Secondary | ICD-10-CM | POA: Diagnosis not present

## 2017-08-12 DIAGNOSIS — Z79899 Other long term (current) drug therapy: Secondary | ICD-10-CM | POA: Diagnosis not present

## 2017-08-12 DIAGNOSIS — R05 Cough: Secondary | ICD-10-CM | POA: Diagnosis not present

## 2017-08-12 DIAGNOSIS — N182 Chronic kidney disease, stage 2 (mild): Secondary | ICD-10-CM | POA: Diagnosis not present

## 2017-08-12 DIAGNOSIS — Z6830 Body mass index (BMI) 30.0-30.9, adult: Secondary | ICD-10-CM | POA: Diagnosis not present

## 2017-08-12 DIAGNOSIS — R0789 Other chest pain: Secondary | ICD-10-CM | POA: Diagnosis not present

## 2017-09-22 DIAGNOSIS — M180 Bilateral primary osteoarthritis of first carpometacarpal joints: Secondary | ICD-10-CM | POA: Diagnosis not present

## 2018-01-28 DIAGNOSIS — R82998 Other abnormal findings in urine: Secondary | ICD-10-CM | POA: Diagnosis not present

## 2018-01-28 DIAGNOSIS — R7301 Impaired fasting glucose: Secondary | ICD-10-CM | POA: Diagnosis not present

## 2018-01-28 DIAGNOSIS — Z125 Encounter for screening for malignant neoplasm of prostate: Secondary | ICD-10-CM | POA: Diagnosis not present

## 2018-01-28 DIAGNOSIS — E782 Mixed hyperlipidemia: Secondary | ICD-10-CM | POA: Diagnosis not present

## 2018-02-04 DIAGNOSIS — H6123 Impacted cerumen, bilateral: Secondary | ICD-10-CM | POA: Diagnosis not present

## 2018-02-04 DIAGNOSIS — Z23 Encounter for immunization: Secondary | ICD-10-CM | POA: Diagnosis not present

## 2018-02-04 DIAGNOSIS — M25511 Pain in right shoulder: Secondary | ICD-10-CM | POA: Diagnosis not present

## 2018-02-04 DIAGNOSIS — Z0000 Encounter for general adult medical examination without abnormal findings: Secondary | ICD-10-CM | POA: Diagnosis not present

## 2018-02-04 DIAGNOSIS — E782 Mixed hyperlipidemia: Secondary | ICD-10-CM | POA: Diagnosis not present

## 2018-02-04 DIAGNOSIS — Z1389 Encounter for screening for other disorder: Secondary | ICD-10-CM | POA: Diagnosis not present

## 2018-02-04 DIAGNOSIS — Z6829 Body mass index (BMI) 29.0-29.9, adult: Secondary | ICD-10-CM | POA: Diagnosis not present

## 2018-02-04 DIAGNOSIS — M79646 Pain in unspecified finger(s): Secondary | ICD-10-CM | POA: Diagnosis not present

## 2018-02-04 DIAGNOSIS — R7301 Impaired fasting glucose: Secondary | ICD-10-CM | POA: Diagnosis not present

## 2018-02-04 DIAGNOSIS — F17210 Nicotine dependence, cigarettes, uncomplicated: Secondary | ICD-10-CM | POA: Diagnosis not present

## 2018-02-04 DIAGNOSIS — Z1212 Encounter for screening for malignant neoplasm of rectum: Secondary | ICD-10-CM | POA: Diagnosis not present

## 2018-03-04 DIAGNOSIS — H6123 Impacted cerumen, bilateral: Secondary | ICD-10-CM | POA: Diagnosis not present

## 2018-03-04 DIAGNOSIS — Z23 Encounter for immunization: Secondary | ICD-10-CM | POA: Diagnosis not present

## 2018-03-04 DIAGNOSIS — H9193 Unspecified hearing loss, bilateral: Secondary | ICD-10-CM | POA: Diagnosis not present

## 2018-03-17 DIAGNOSIS — H353121 Nonexudative age-related macular degeneration, left eye, early dry stage: Secondary | ICD-10-CM | POA: Diagnosis not present

## 2018-03-17 DIAGNOSIS — H353212 Exudative age-related macular degeneration, right eye, with inactive choroidal neovascularization: Secondary | ICD-10-CM | POA: Diagnosis not present

## 2018-03-17 DIAGNOSIS — H25813 Combined forms of age-related cataract, bilateral: Secondary | ICD-10-CM | POA: Diagnosis not present

## 2018-03-17 DIAGNOSIS — H43812 Vitreous degeneration, left eye: Secondary | ICD-10-CM | POA: Diagnosis not present

## 2018-05-03 DIAGNOSIS — Z86018 Personal history of other benign neoplasm: Secondary | ICD-10-CM | POA: Diagnosis not present

## 2018-05-03 DIAGNOSIS — Z23 Encounter for immunization: Secondary | ICD-10-CM | POA: Diagnosis not present

## 2018-05-03 DIAGNOSIS — D225 Melanocytic nevi of trunk: Secondary | ICD-10-CM | POA: Diagnosis not present

## 2018-05-03 DIAGNOSIS — L821 Other seborrheic keratosis: Secondary | ICD-10-CM | POA: Diagnosis not present

## 2018-05-03 DIAGNOSIS — L814 Other melanin hyperpigmentation: Secondary | ICD-10-CM | POA: Diagnosis not present

## 2018-05-05 DIAGNOSIS — N182 Chronic kidney disease, stage 2 (mild): Secondary | ICD-10-CM | POA: Diagnosis not present

## 2018-05-05 DIAGNOSIS — J069 Acute upper respiratory infection, unspecified: Secondary | ICD-10-CM | POA: Diagnosis not present

## 2018-05-05 DIAGNOSIS — R05 Cough: Secondary | ICD-10-CM | POA: Diagnosis not present

## 2018-05-05 DIAGNOSIS — Z6830 Body mass index (BMI) 30.0-30.9, adult: Secondary | ICD-10-CM | POA: Diagnosis not present

## 2018-05-31 DIAGNOSIS — M13842 Other specified arthritis, left hand: Secondary | ICD-10-CM | POA: Diagnosis not present

## 2018-05-31 DIAGNOSIS — M13841 Other specified arthritis, right hand: Secondary | ICD-10-CM | POA: Diagnosis not present

## 2018-07-15 DIAGNOSIS — H2513 Age-related nuclear cataract, bilateral: Secondary | ICD-10-CM | POA: Diagnosis not present

## 2018-11-04 DIAGNOSIS — J069 Acute upper respiratory infection, unspecified: Secondary | ICD-10-CM | POA: Diagnosis not present

## 2019-02-10 DIAGNOSIS — R82998 Other abnormal findings in urine: Secondary | ICD-10-CM | POA: Diagnosis not present

## 2019-02-10 DIAGNOSIS — Z125 Encounter for screening for malignant neoplasm of prostate: Secondary | ICD-10-CM | POA: Diagnosis not present

## 2019-02-10 DIAGNOSIS — Z23 Encounter for immunization: Secondary | ICD-10-CM | POA: Diagnosis not present

## 2019-02-10 DIAGNOSIS — N182 Chronic kidney disease, stage 2 (mild): Secondary | ICD-10-CM | POA: Diagnosis not present

## 2019-02-10 DIAGNOSIS — E782 Mixed hyperlipidemia: Secondary | ICD-10-CM | POA: Diagnosis not present

## 2019-02-10 DIAGNOSIS — R7301 Impaired fasting glucose: Secondary | ICD-10-CM | POA: Diagnosis not present

## 2019-02-17 ENCOUNTER — Other Ambulatory Visit: Payer: Self-pay

## 2019-02-17 ENCOUNTER — Ambulatory Visit (INDEPENDENT_AMBULATORY_CARE_PROVIDER_SITE_OTHER): Payer: Medicare Other | Admitting: Podiatry

## 2019-02-17 ENCOUNTER — Encounter: Payer: Self-pay | Admitting: Podiatry

## 2019-02-17 DIAGNOSIS — N400 Enlarged prostate without lower urinary tract symptoms: Secondary | ICD-10-CM | POA: Diagnosis not present

## 2019-02-17 DIAGNOSIS — Z1331 Encounter for screening for depression: Secondary | ICD-10-CM | POA: Diagnosis not present

## 2019-02-17 DIAGNOSIS — Z1339 Encounter for screening examination for other mental health and behavioral disorders: Secondary | ICD-10-CM | POA: Diagnosis not present

## 2019-02-17 DIAGNOSIS — L84 Corns and callosities: Secondary | ICD-10-CM

## 2019-02-17 DIAGNOSIS — F17210 Nicotine dependence, cigarettes, uncomplicated: Secondary | ICD-10-CM | POA: Diagnosis not present

## 2019-02-17 DIAGNOSIS — E782 Mixed hyperlipidemia: Secondary | ICD-10-CM | POA: Diagnosis not present

## 2019-02-17 DIAGNOSIS — Z0000 Encounter for general adult medical examination without abnormal findings: Secondary | ICD-10-CM | POA: Diagnosis not present

## 2019-02-17 DIAGNOSIS — R7301 Impaired fasting glucose: Secondary | ICD-10-CM | POA: Diagnosis not present

## 2019-02-23 NOTE — Progress Notes (Signed)
Subjective:   Patient ID: Cory Walls, male   DOB: 73 y.o.   MRN: [id number]   HPI Patient presents with painful lesion underneath the right foot that he cannot take care of himself and has not been seen for over 2 years   ROS      Objective:  Physical Exam  Neurovascular status intact with keratotic lesion subsecond metatarsal right that is very painful     Assessment:  Chronic callus formation secondary to bone pressure     Plan:  Debridement accomplished and will be done on an as-needed basis and may require more aggressive treatment if symptoms were to come back quick

## 2019-03-01 DIAGNOSIS — Z1212 Encounter for screening for malignant neoplasm of rectum: Secondary | ICD-10-CM | POA: Diagnosis not present

## 2019-03-02 ENCOUNTER — Telehealth: Payer: Self-pay | Admitting: *Deleted

## 2019-03-02 NOTE — Telephone Encounter (Signed)
Pt states 02/17/2019 Dr. Paulla Dolly performed a procedure to remove a impacted sweat gland, now there is loose dead skin to the area.

## 2019-03-02 NOTE — Telephone Encounter (Signed)
I told pt that the edges of the cut skin will collect water and puff up and peel away, but will gradually the skin will slough off to skin with no edges. Pt states understanding.

## 2019-03-30 DIAGNOSIS — H353121 Nonexudative age-related macular degeneration, left eye, early dry stage: Secondary | ICD-10-CM | POA: Diagnosis not present

## 2019-03-30 DIAGNOSIS — H43812 Vitreous degeneration, left eye: Secondary | ICD-10-CM | POA: Diagnosis not present

## 2019-03-30 DIAGNOSIS — H2513 Age-related nuclear cataract, bilateral: Secondary | ICD-10-CM | POA: Diagnosis not present

## 2019-03-30 DIAGNOSIS — H353212 Exudative age-related macular degeneration, right eye, with inactive choroidal neovascularization: Secondary | ICD-10-CM | POA: Diagnosis not present

## 2019-05-10 DIAGNOSIS — D225 Melanocytic nevi of trunk: Secondary | ICD-10-CM | POA: Diagnosis not present

## 2019-05-10 DIAGNOSIS — L814 Other melanin hyperpigmentation: Secondary | ICD-10-CM | POA: Diagnosis not present

## 2019-05-10 DIAGNOSIS — L821 Other seborrheic keratosis: Secondary | ICD-10-CM | POA: Diagnosis not present

## 2019-05-10 DIAGNOSIS — Z86018 Personal history of other benign neoplasm: Secondary | ICD-10-CM | POA: Diagnosis not present

## 2019-07-08 ENCOUNTER — Ambulatory Visit: Payer: Medicare Other | Attending: Internal Medicine

## 2019-07-08 DIAGNOSIS — Z23 Encounter for immunization: Secondary | ICD-10-CM | POA: Insufficient documentation

## 2019-07-08 NOTE — Progress Notes (Signed)
   Covid-19 Vaccination Clinic  Name:  Ritwik Lapinsky    MRN: [id number] DOB: [birth year]  07/08/2019  Mr. Klindt was observed post Covid-19 immunization for 15 minutes without incidence. He was provided with Vaccine Information Sheet and instruction to access the V-Safe system.   Mr. Mcdole was instructed to call 911 with any severe reactions post vaccine: Marland Kitchen Difficulty breathing  . Swelling of your face and throat  . A fast heartbeat  . A bad rash all over your body  . Dizziness and weakness    Immunizations Administered    Name Date Dose VIS Date Route   Pfizer COVID-19 Vaccine 07/08/2019 11:12 AM 0.3 mL 05/26/2019 Intramuscular   Manufacturer: [place]   Lot: [id number]   [place]: [id number]

## 2019-07-20 DIAGNOSIS — H5213 Myopia, bilateral: Secondary | ICD-10-CM | POA: Diagnosis not present

## 2019-07-20 DIAGNOSIS — H353111 Nonexudative age-related macular degeneration, right eye, early dry stage: Secondary | ICD-10-CM | POA: Diagnosis not present

## 2019-07-20 DIAGNOSIS — H2513 Age-related nuclear cataract, bilateral: Secondary | ICD-10-CM | POA: Diagnosis not present

## 2019-07-20 DIAGNOSIS — H52203 Unspecified astigmatism, bilateral: Secondary | ICD-10-CM | POA: Diagnosis not present

## 2019-07-29 ENCOUNTER — Ambulatory Visit: Payer: Medicare Other | Attending: Internal Medicine

## 2019-07-29 DIAGNOSIS — Z23 Encounter for immunization: Secondary | ICD-10-CM | POA: Insufficient documentation

## 2019-07-29 NOTE — Progress Notes (Signed)
   Covid-19 Vaccination Clinic  Name:  Cory Walls    MRN: [id number] DOB: 1946/04/10  07/29/2019  [name] was observed post Covid-19 immunization for  15 minutes without incidence. He was provided with Vaccine Information Sheet and instruction to access the V-Safe system.   [name] was instructed to call 911 with any severe reactions post vaccine: Marland Kitchen Difficulty breathing  . Swelling of your face and throat  . A fast heartbeat  . A bad rash all over your body  . Dizziness and weakness    Immunizations Administered    Name Date Dose VIS Date Route   Pfizer COVID-19 Vaccine 07/29/2019  9:10 AM 0.3 mL 05/26/2019 Intramuscular   Manufacturer: [place]   Lot: [id number]   [place]: [id number]

## 2019-08-04 DIAGNOSIS — M13841 Other specified arthritis, right hand: Secondary | ICD-10-CM | POA: Diagnosis not present

## 2019-08-04 DIAGNOSIS — M13842 Other specified arthritis, left hand: Secondary | ICD-10-CM | POA: Diagnosis not present

## 2019-09-01 DIAGNOSIS — M13842 Other specified arthritis, left hand: Secondary | ICD-10-CM | POA: Diagnosis not present

## 2019-09-01 DIAGNOSIS — M13841 Other specified arthritis, right hand: Secondary | ICD-10-CM | POA: Diagnosis not present

## 2019-10-03 DIAGNOSIS — R7301 Impaired fasting glucose: Secondary | ICD-10-CM | POA: Diagnosis not present

## 2019-10-03 DIAGNOSIS — E782 Mixed hyperlipidemia: Secondary | ICD-10-CM | POA: Diagnosis not present

## 2019-10-10 DIAGNOSIS — M79646 Pain in unspecified finger(s): Secondary | ICD-10-CM | POA: Diagnosis not present

## 2019-10-10 DIAGNOSIS — F17210 Nicotine dependence, cigarettes, uncomplicated: Secondary | ICD-10-CM | POA: Diagnosis not present

## 2019-10-10 DIAGNOSIS — E782 Mixed hyperlipidemia: Secondary | ICD-10-CM | POA: Diagnosis not present

## 2019-10-10 DIAGNOSIS — R030 Elevated blood-pressure reading, without diagnosis of hypertension: Secondary | ICD-10-CM | POA: Diagnosis not present

## 2019-10-10 DIAGNOSIS — Z1389 Encounter for screening for other disorder: Secondary | ICD-10-CM | POA: Diagnosis not present

## 2019-10-10 DIAGNOSIS — E119 Type 2 diabetes mellitus without complications: Secondary | ICD-10-CM | POA: Diagnosis not present

## 2019-10-26 DIAGNOSIS — H25813 Combined forms of age-related cataract, bilateral: Secondary | ICD-10-CM | POA: Diagnosis not present

## 2019-10-26 DIAGNOSIS — H353212 Exudative age-related macular degeneration, right eye, with inactive choroidal neovascularization: Secondary | ICD-10-CM | POA: Diagnosis not present

## 2019-10-26 DIAGNOSIS — H353121 Nonexudative age-related macular degeneration, left eye, early dry stage: Secondary | ICD-10-CM | POA: Diagnosis not present

## 2019-10-26 DIAGNOSIS — H43812 Vitreous degeneration, left eye: Secondary | ICD-10-CM | POA: Diagnosis not present

## 2020-02-15 DIAGNOSIS — E782 Mixed hyperlipidemia: Secondary | ICD-10-CM | POA: Diagnosis not present

## 2020-02-15 DIAGNOSIS — Z125 Encounter for screening for malignant neoplasm of prostate: Secondary | ICD-10-CM | POA: Diagnosis not present

## 2020-02-15 DIAGNOSIS — E119 Type 2 diabetes mellitus without complications: Secondary | ICD-10-CM | POA: Diagnosis not present

## 2020-02-15 DIAGNOSIS — R82998 Other abnormal findings in urine: Secondary | ICD-10-CM | POA: Diagnosis not present

## 2020-02-15 DIAGNOSIS — Z1212 Encounter for screening for malignant neoplasm of rectum: Secondary | ICD-10-CM | POA: Diagnosis not present

## 2020-02-22 DIAGNOSIS — E782 Mixed hyperlipidemia: Secondary | ICD-10-CM | POA: Diagnosis not present

## 2020-02-22 DIAGNOSIS — E119 Type 2 diabetes mellitus without complications: Secondary | ICD-10-CM | POA: Diagnosis not present

## 2020-02-22 DIAGNOSIS — F17210 Nicotine dependence, cigarettes, uncomplicated: Secondary | ICD-10-CM | POA: Diagnosis not present

## 2020-02-22 DIAGNOSIS — N400 Enlarged prostate without lower urinary tract symptoms: Secondary | ICD-10-CM | POA: Diagnosis not present

## 2020-02-22 DIAGNOSIS — M25511 Pain in right shoulder: Secondary | ICD-10-CM | POA: Diagnosis not present

## 2020-02-22 DIAGNOSIS — R030 Elevated blood-pressure reading, without diagnosis of hypertension: Secondary | ICD-10-CM | POA: Diagnosis not present

## 2020-02-22 DIAGNOSIS — Z0000 Encounter for general adult medical examination without abnormal findings: Secondary | ICD-10-CM | POA: Diagnosis not present

## 2020-02-22 DIAGNOSIS — H9193 Unspecified hearing loss, bilateral: Secondary | ICD-10-CM | POA: Diagnosis not present

## 2020-02-22 DIAGNOSIS — M180 Bilateral primary osteoarthritis of first carpometacarpal joints: Secondary | ICD-10-CM | POA: Diagnosis not present

## 2020-02-23 DIAGNOSIS — Z23 Encounter for immunization: Secondary | ICD-10-CM | POA: Diagnosis not present

## 2020-03-05 ENCOUNTER — Telehealth: Payer: Self-pay | Admitting: Internal Medicine

## 2020-03-05 NOTE — Telephone Encounter (Signed)
Patient called to check when he was due for his recall and stated he would need to speak with Dr. Carlean Purl in reference to his prep medication because the last time he had a bad side effect and ended up in the ED.

## 2020-03-06 NOTE — Telephone Encounter (Signed)
Patient notified Dr. Carlean Purl is out of the office this week. Patient is asking about the prep options for colonoscopy.  He was admitted for intractable vomiting with Miralax and Dulcolax prep last time.  Admission was for 5 days.  Would like to discuss prep options prior to recall due in Dec.  He is aware we will call him next week once Dr. Carlean Purl can review and advise.

## 2020-03-11 NOTE — Telephone Encounter (Signed)
I had documented that we would do an office visit prior to any possible repeat colonoscopy due to his repetitive nausea and vomiting issues despite different preps   I will see that when I do recalls and schedule appropriately then.  I called and explained.  He did note that he tolerated the dulcolax so may do 2 day clears and lax prep

## 2020-03-20 DIAGNOSIS — Z23 Encounter for immunization: Secondary | ICD-10-CM | POA: Diagnosis not present

## 2020-05-20 DIAGNOSIS — Z86018 Personal history of other benign neoplasm: Secondary | ICD-10-CM | POA: Diagnosis not present

## 2020-05-20 DIAGNOSIS — D225 Melanocytic nevi of trunk: Secondary | ICD-10-CM | POA: Diagnosis not present

## 2020-05-20 DIAGNOSIS — L814 Other melanin hyperpigmentation: Secondary | ICD-10-CM | POA: Diagnosis not present

## 2020-05-20 DIAGNOSIS — L821 Other seborrheic keratosis: Secondary | ICD-10-CM | POA: Diagnosis not present

## 2020-05-20 DIAGNOSIS — L578 Other skin changes due to chronic exposure to nonionizing radiation: Secondary | ICD-10-CM | POA: Diagnosis not present

## 2020-07-05 ENCOUNTER — Telehealth: Payer: Self-pay | Admitting: *Deleted

## 2020-07-05 DIAGNOSIS — Z86010 Personal history of colonic polyps: Secondary | ICD-10-CM

## 2020-07-05 DIAGNOSIS — M25511 Pain in right shoulder: Secondary | ICD-10-CM | POA: Diagnosis not present

## 2020-07-05 DIAGNOSIS — R030 Elevated blood-pressure reading, without diagnosis of hypertension: Secondary | ICD-10-CM | POA: Diagnosis not present

## 2020-07-05 DIAGNOSIS — E782 Mixed hyperlipidemia: Secondary | ICD-10-CM | POA: Diagnosis not present

## 2020-07-05 DIAGNOSIS — E119 Type 2 diabetes mellitus without complications: Secondary | ICD-10-CM | POA: Diagnosis not present

## 2020-07-05 DIAGNOSIS — M180 Bilateral primary osteoarthritis of first carpometacarpal joints: Secondary | ICD-10-CM | POA: Diagnosis not present

## 2020-07-05 NOTE — Telephone Encounter (Signed)
I will call him instead - I will probably see him at church Sunday also  But I will cc this to me as well and figure it out in advance and send the instructions to you guys

## 2020-07-05 NOTE — Telephone Encounter (Signed)
OK thanks -- Ill leave him as scheduled and you let me know about prep  Thanks - Lelan Pons

## 2020-07-05 NOTE — Telephone Encounter (Signed)
Dr Carlean Purl,  This is the last TE with you on this pt  9-21  Gatha Mayer, MD     12:29 PM Note I had documented that we would do an office visit prior to any possible repeat colonoscopy due to his repetitive nausea and vomiting issues despite different preps   I will see that when I do recalls and schedule appropriately then.  I called and explained.  He did note that he tolerated the dulcolax so may do 2 day clears and lax prep     Does he need to be scheduled for an OV-  He has a PV 2-1 and a colon with you 2-15   Please advise- Thanks so much Lelan Pons PV

## 2020-07-11 ENCOUNTER — Other Ambulatory Visit: Payer: Self-pay | Admitting: Internal Medicine

## 2020-07-12 NOTE — Telephone Encounter (Signed)
Good morning Dr. Carlean Purl, patient called to apologize for not being able to answer your call and is requesting a call back from you again if possible.  Thank you.

## 2020-07-12 NOTE — Telephone Encounter (Signed)
I called the patient and discussed his difficulty with nausea and vomiting with prior colonoscopy preps and we have come up with the following plan which I reviewed with him though not completely.   Linzess samples 290 mcg 1 each day on 3 days and 2 days before his colonoscopy.  Take each morning on an empty stomach.  For his preparation he starts clear liquids the day before colonoscopy  Reglan 10 mg tablet to be taken at around 3 PM the day before colonoscopy  At 4 PM take 12 Senokot tablets as written in the pended prescription followed by large amounts of clear liquid at least 312 ounce drinks  Repeat Reglan at 9 PM and repeat Senokot at 10 PM   Let me know if any questions

## 2020-07-16 ENCOUNTER — Other Ambulatory Visit: Payer: Self-pay

## 2020-07-16 ENCOUNTER — Ambulatory Visit (AMBULATORY_SURGERY_CENTER): Payer: Self-pay | Admitting: *Deleted

## 2020-07-16 VITALS — Ht 72.0 in | Wt 207.2 lb

## 2020-07-16 DIAGNOSIS — Z86010 Personal history of colonic polyps: Secondary | ICD-10-CM

## 2020-07-16 MED ORDER — METOCLOPRAMIDE HCL 10 MG PO TABS: ORAL_TABLET | ORAL | 0 refills | Status: DC

## 2020-07-16 MED ORDER — SENNA 8.6 MG PO CAPS
ORAL_CAPSULE | ORAL | 0 refills | Status: DC
Start: 2020-07-16 — End: 2020-07-30

## 2020-07-16 NOTE — Progress Notes (Signed)
No egg or soy allergy known to patient  No issues with past sedation with any surgeries or procedures No intubation problems in the past  No FH of Malignant Hyperthermia No diet pills per patient No home 02 use per patient  No blood thinners per patient  Pt denies issues with constipation  No A fib or A flutter  EMMI video to pt or via [place] 19 guidelines implemented in PV today with Pt and RN  Pt is fully vaccinated  for Covid   Due to the COVID-19 pandemic we are asking patients to follow certain guidelines.  Pt aware of COVID protocols and [facility] guidelines   See instructions per Carlean Purl for pt's prep

## 2020-07-23 DIAGNOSIS — H52203 Unspecified astigmatism, bilateral: Secondary | ICD-10-CM | POA: Diagnosis not present

## 2020-07-23 DIAGNOSIS — H5213 Myopia, bilateral: Secondary | ICD-10-CM | POA: Diagnosis not present

## 2020-07-23 DIAGNOSIS — H2513 Age-related nuclear cataract, bilateral: Secondary | ICD-10-CM | POA: Diagnosis not present

## 2020-07-23 DIAGNOSIS — E119 Type 2 diabetes mellitus without complications: Secondary | ICD-10-CM | POA: Diagnosis not present

## 2020-07-30 ENCOUNTER — Other Ambulatory Visit: Payer: Self-pay

## 2020-07-30 ENCOUNTER — Ambulatory Visit (AMBULATORY_SURGERY_CENTER): Payer: Medicare Other | Admitting: Internal Medicine

## 2020-07-30 ENCOUNTER — Encounter: Payer: Self-pay | Admitting: Internal Medicine

## 2020-07-30 VITALS — BP 153/80 | HR 59 | Temp 98.2°F | Resp 22 | Ht 72.0 in | Wt 207.0 lb

## 2020-07-30 DIAGNOSIS — D120 Benign neoplasm of cecum: Secondary | ICD-10-CM | POA: Diagnosis not present

## 2020-07-30 DIAGNOSIS — D128 Benign neoplasm of rectum: Secondary | ICD-10-CM | POA: Diagnosis not present

## 2020-07-30 DIAGNOSIS — D127 Benign neoplasm of rectosigmoid junction: Secondary | ICD-10-CM | POA: Diagnosis not present

## 2020-07-30 DIAGNOSIS — Z86010 Personal history of colonic polyps: Secondary | ICD-10-CM

## 2020-07-30 DIAGNOSIS — D125 Benign neoplasm of sigmoid colon: Secondary | ICD-10-CM | POA: Diagnosis not present

## 2020-07-30 DIAGNOSIS — D124 Benign neoplasm of descending colon: Secondary | ICD-10-CM

## 2020-07-30 MED ORDER — SODIUM CHLORIDE 0.9 % IV SOLN: 500.0000 mL | Freq: Once | INTRAVENOUS | Status: DC

## 2020-07-30 NOTE — Op Note (Addendum)
[place] Patient Name: Cory Walls Procedure Date: 07/30/2020 8:54 AM MRN: [id number] Endoscopist: Gatha Mayer , MD Age: 75 Referring MD:  Date of Birth: November 24, 1945 Gender: Male Account #: [id number] Procedure:                Colonoscopy Indications:              Surveillance: Personal history of adenomatous                            polyps on last colonoscopy > 5 years ago Medicines:                Propofol per Anesthesia, Monitored Anesthesia Care Procedure:                Pre-Anesthesia Assessment:                           - Prior to the procedure, a History and Physical                            was performed, and patient medications and                            allergies were reviewed. The patient's tolerance of                            previous anesthesia was also reviewed. The risks                            and benefits of the procedure and the sedation                            options and risks were discussed with the patient.                            All questions were answered, and informed consent                            was obtained. Prior Anticoagulants: The patient has                            taken no previous anticoagulant or antiplatelet                            agents. ASA Grade Assessment: II - A patient with                            mild systemic disease. After reviewing the risks                            and benefits, the patient was deemed in                            satisfactory condition to undergo the procedure.  After obtaining informed consent, the colonoscope                            was passed under direct vision. Throughout the                            procedure, the patient's blood pressure, pulse, and                            oxygen saturations were monitored continuously. The                            Olympus CF-HQ190L (Serial# [id number]) Colonoscope was                             introduced through the anus and advanced to the the                            cecum, identified by appendiceal orifice and                            ileocecal valve. The colonoscopy was somewhat                            difficult due to poor endoscopic visualization.                            Successful completion of the procedure was aided by                            lavage. The patient tolerated the procedure well.                            The quality of the bowel preparation was adequate.                            The ileocecal valve, appendiceal orifice, and                            rectum were photographed. The bowel preparation                            used was senna (Senokot) s via split dose                            instruction. Pre-prep Linzess also Scope In: 9:08:55 AM Scope Out: 9:33:51 AM Scope Withdrawal Time: 0 hours 21 minutes 9 seconds  Total Procedure Duration: 0 hours 24 minutes 56 seconds  Findings:                 The perianal and digital rectal examinations were                            normal. Pertinent negatives include normal prostate                            (  size, shape, and consistency).                           Six sessile polyps were found in the sigmoid colon,                            descending colon and cecum. The polyps were                            diminutive in size. These polyps were removed with                            a cold snare. Resection and retrieval were                            complete. Verification of patient identification                            for the specimen was done. Estimated blood loss was                            minimal.                           There was evidence of a prior surgical anastomosis                            in the rectum. This was characterized by healthy                            appearing mucosa. The anastomosis was traversed.                           Two small angiodysplastic  lesions were found in the                            cecum.                           The exam was otherwise without abnormality on                            direct and retroflexion views. Complications:            No immediate complications. Estimated Blood Loss:     Estimated blood loss was minimal. Impression:               - Six diminutive polyps in the sigmoid colon, in                            the descending colon and in the cecum, removed with                            a cold snare. Resected and retrieved.                           -  Surgical anastomosis, characterized by healthy                            appearing mucosa.                           - Two colonic angiodysplastic lesions.                           - The examination was otherwise normal on direct                            and retroflexion views.                           - Personal history of colonic polyps.                           -2006 - 6 adenomas (also had polyps 2001 approx, in                            CT)                           -05/25/2012 4 diminutive polyps - all tubular                            adenomas                           -05/21/2015 2 diminutive polyps ssp and tub adenoma - Recommendation:           - Patient has a contact number available for                            emergencies. The signs and symptoms of potential                            delayed complications were discussed with the                            patient. Return to normal activities tomorrow.                            Written discharge instructions were provided to the                            patient.                           - Resume previous diet.                           - Continue present medications.                           - No recommendation at this time regarding repeat  colonoscopy due to age. Maybe repeat. Will discuss.                            Will use similar prep  again but note he says not                            much effect from high-dose Linzess samples given a                            in the days before                           - Await pathology results. Gatha Mayer, MD 07/30/2020 9:44:35 AM This report has been signed electronically.

## 2020-07-30 NOTE — Progress Notes (Signed)
Called to room to assist during endoscopic procedure.  Patient ID and intended procedure confirmed with present staff. Received instructions for my participation in the procedure from the performing physician.  

## 2020-07-30 NOTE — Progress Notes (Signed)
PT taken to PACU. Monitors in place. VSS. Report given to RN. 

## 2020-07-30 NOTE — Progress Notes (Signed)
VS by CW  I have reviewed the patient's medical history in detail and updated the computerized patient record.  

## 2020-07-30 NOTE — Patient Instructions (Signed)
YOU HAD AN ENDOSCOPIC PROCEDURE TODAY AT THE [facility]:   Refer to the procedure report that was given to you for any specific questions about what was found during the examination.  If the procedure report does not answer your questions, please call your gastroenterologist to clarify.  If you requested that your care partner not be given the details of your procedure findings, then the procedure report has been included in a sealed envelope for you to review at your convenience later.  YOU SHOULD EXPECT: Some feelings of bloating in the abdomen. Passage of more gas than usual.  Walking can help get rid of the air that was put into your GI tract during the procedure and reduce the bloating. If you had a lower endoscopy (such as a colonoscopy or flexible sigmoidoscopy) you may notice spotting of blood in your stool or on the toilet paper. If you underwent a bowel prep for your procedure, you may not have a normal bowel movement for a few days.  Please Note:  You might notice some irritation and congestion in your nose or some drainage.  This is from the oxygen used during your procedure.  There is no need for concern and it should clear up in a day or so.  SYMPTOMS TO REPORT IMMEDIATELY:   Following lower endoscopy (colonoscopy or flexible sigmoidoscopy):  Excessive amounts of blood in the stool  Significant tenderness or worsening of abdominal pains  Swelling of the abdomen that is new, acute  Fever of 100F or higher  For urgent or emergent issues, a gastroenterologist can be reached at any hour by calling [phone]. Do not use MyChart messaging for urgent concerns.    DIET:  We do recommend a small meal at first, but then you may proceed to your regular diet.  Drink plenty of fluids but you should avoid alcoholic beverages for 24 hours.  ACTIVITY:  You should plan to take it easy for the rest of today and you should NOT DRIVE or use heavy machinery until tomorrow (because  of the sedation medicines used during the test).    FOLLOW UP: Our staff will call the number listed on your records 48-72 hours following your procedure to check on you and address any questions or concerns that you may have regarding the information given to you following your procedure. If we do not reach you, we will leave a message.  We will attempt to reach you two times.  During this call, we will ask if you have developed any symptoms of COVID 19. If you develop any symptoms (ie: fever, flu-like symptoms, shortness of breath, cough etc.) before then, please call [phone].  If you test positive for Covid 19 in the 2 weeks post procedure, please call and report this information to us.    If any biopsies were taken you will be contacted by phone or by letter within the next 1-3 weeks.  Please call us at [phone] if you have not heard about the biopsies in 3 weeks.    SIGNATURES/CONFIDENTIALITY: You and/or your care partner have signed paperwork which will be entered into your electronic medical record.  These signatures attest to the fact that that the information above on your After Visit Summary has been reviewed and is understood.  Full responsibility of the confidentiality of this discharge information lies with you and/or your care-partner. 

## 2020-08-01 ENCOUNTER — Telehealth: Payer: Self-pay

## 2020-08-01 NOTE — Telephone Encounter (Signed)
  Follow up Call-  Call back number 07/30/2020  Post procedure Call Back phone  # [phone]  Permission to leave phone message No  Some recent data might be hidden     Patient questions:  Do you have a fever, pain , or abdominal swelling? No. Pain Score  0 *  Have you tolerated food without any problems? Yes.    Have you been able to return to your normal activities? Yes.    Do you have any questions about your discharge instructions: Diet   No. Medications  No. Follow up visit  No.  Do you have questions or concerns about your Care? No.  Actions: * If pain score is 4 or above: No action needed, pain <4. 1. Have you developed a fever since your procedure? no  2.   Have you had an respiratory symptoms (SOB or cough) since your procedure? no  3.   Have you tested positive for COVID 19 since your procedure no  4.   Have you had any family members/close contacts diagnosed with the COVID 19 since your procedure?  no   If yes to any of these questions please route to Joylene John, RN and Joella Prince, RN

## 2020-08-13 ENCOUNTER — Encounter: Payer: Self-pay | Admitting: Internal Medicine

## 2020-10-31 DIAGNOSIS — H43812 Vitreous degeneration, left eye: Secondary | ICD-10-CM | POA: Diagnosis not present

## 2020-10-31 DIAGNOSIS — H353121 Nonexudative age-related macular degeneration, left eye, early dry stage: Secondary | ICD-10-CM | POA: Diagnosis not present

## 2020-10-31 DIAGNOSIS — H25813 Combined forms of age-related cataract, bilateral: Secondary | ICD-10-CM | POA: Diagnosis not present

## 2020-10-31 DIAGNOSIS — E119 Type 2 diabetes mellitus without complications: Secondary | ICD-10-CM | POA: Diagnosis not present

## 2020-10-31 DIAGNOSIS — H353212 Exudative age-related macular degeneration, right eye, with inactive choroidal neovascularization: Secondary | ICD-10-CM | POA: Diagnosis not present

## 2020-12-18 DIAGNOSIS — H6123 Impacted cerumen, bilateral: Secondary | ICD-10-CM | POA: Diagnosis not present

## 2020-12-18 DIAGNOSIS — H9312 Tinnitus, left ear: Secondary | ICD-10-CM | POA: Diagnosis not present

## 2020-12-18 DIAGNOSIS — H9192 Unspecified hearing loss, left ear: Secondary | ICD-10-CM | POA: Diagnosis not present

## 2021-01-31 DIAGNOSIS — H9313 Tinnitus, bilateral: Secondary | ICD-10-CM | POA: Diagnosis not present

## 2021-01-31 DIAGNOSIS — H9222 Otorrhagia, left ear: Secondary | ICD-10-CM | POA: Diagnosis not present

## 2021-02-11 DIAGNOSIS — H903 Sensorineural hearing loss, bilateral: Secondary | ICD-10-CM | POA: Diagnosis not present

## 2021-03-10 DIAGNOSIS — R82998 Other abnormal findings in urine: Secondary | ICD-10-CM | POA: Diagnosis not present

## 2021-03-10 DIAGNOSIS — E119 Type 2 diabetes mellitus without complications: Secondary | ICD-10-CM | POA: Diagnosis not present

## 2021-03-10 DIAGNOSIS — Z1212 Encounter for screening for malignant neoplasm of rectum: Secondary | ICD-10-CM | POA: Diagnosis not present

## 2021-03-10 DIAGNOSIS — Z125 Encounter for screening for malignant neoplasm of prostate: Secondary | ICD-10-CM | POA: Diagnosis not present

## 2021-03-10 DIAGNOSIS — E782 Mixed hyperlipidemia: Secondary | ICD-10-CM | POA: Diagnosis not present

## 2021-03-17 DIAGNOSIS — Z23 Encounter for immunization: Secondary | ICD-10-CM | POA: Diagnosis not present

## 2021-03-17 DIAGNOSIS — Z0000 Encounter for general adult medical examination without abnormal findings: Secondary | ICD-10-CM | POA: Diagnosis not present

## 2021-03-17 DIAGNOSIS — E119 Type 2 diabetes mellitus without complications: Secondary | ICD-10-CM | POA: Diagnosis not present

## 2021-03-17 DIAGNOSIS — M180 Bilateral primary osteoarthritis of first carpometacarpal joints: Secondary | ICD-10-CM | POA: Diagnosis not present

## 2021-03-17 DIAGNOSIS — R3911 Hesitancy of micturition: Secondary | ICD-10-CM | POA: Diagnosis not present

## 2021-03-17 DIAGNOSIS — H9193 Unspecified hearing loss, bilateral: Secondary | ICD-10-CM | POA: Diagnosis not present

## 2021-03-17 DIAGNOSIS — F17210 Nicotine dependence, cigarettes, uncomplicated: Secondary | ICD-10-CM | POA: Diagnosis not present

## 2021-03-17 DIAGNOSIS — N401 Enlarged prostate with lower urinary tract symptoms: Secondary | ICD-10-CM | POA: Diagnosis not present

## 2021-03-17 DIAGNOSIS — E782 Mixed hyperlipidemia: Secondary | ICD-10-CM | POA: Diagnosis not present

## 2021-04-21 ENCOUNTER — Ambulatory Visit (INDEPENDENT_AMBULATORY_CARE_PROVIDER_SITE_OTHER): Payer: Medicare Other

## 2021-04-21 ENCOUNTER — Other Ambulatory Visit: Payer: Self-pay

## 2021-04-21 ENCOUNTER — Ambulatory Visit (INDEPENDENT_AMBULATORY_CARE_PROVIDER_SITE_OTHER): Payer: Medicare Other | Admitting: Podiatry

## 2021-04-21 ENCOUNTER — Encounter: Payer: Self-pay | Admitting: Podiatry

## 2021-04-21 DIAGNOSIS — M722 Plantar fascial fibromatosis: Secondary | ICD-10-CM

## 2021-04-21 MED ORDER — TRIAMCINOLONE ACETONIDE 10 MG/ML IJ SUSP
10.0000 mg | Freq: Once | INTRAMUSCULAR | Status: AC
Start: 2021-04-21 — End: 2021-04-21
  Administered 2021-04-21: 10 mg

## 2021-04-21 NOTE — Progress Notes (Signed)
Subjective:   Patient ID: Cory Walls, male   DOB: 75 y.o.   MRN: [id number]   HPI Patient presents with significant depression of a pathological arch right with inflammation of the mid arch area painful when pressed making walking difficult   ROS      Objective:  Physical Exam  Acute fasciitis like symptoms right mid arch with collapse medial longitudinal arch is part of the pathology     Assessment:  Pain in the mid arch area with swelling of the tissue     Plan:  Sterile prep injected the mid arch right 3 mg Kenalog 5 mg Xylocaine applied fascial brace to lift up the arch discussed possible orthotics or other treatments with MRI or other treatment may be necessary reappoint to recheck  X-rays indicate significant collapse medial longitudinal arch right with signs of arthritis no other pathology noted

## 2021-04-25 ENCOUNTER — Emergency Department (HOSPITAL_COMMUNITY): Payer: Medicare Other

## 2021-04-25 ENCOUNTER — Other Ambulatory Visit: Payer: Self-pay

## 2021-04-25 ENCOUNTER — Encounter (HOSPITAL_COMMUNITY): Payer: Self-pay | Admitting: Emergency Medicine

## 2021-04-25 ENCOUNTER — Emergency Department (HOSPITAL_COMMUNITY)
Admission: EM | Admit: 2021-04-25 | Discharge: 2021-04-25 | Disposition: A | Discharge status: Home / Self Care | Payer: Medicare Other | Attending: Medical | Admitting: Medical

## 2021-04-25 DIAGNOSIS — R319 Hematuria, unspecified: Secondary | ICD-10-CM | POA: Diagnosis not present

## 2021-04-25 DIAGNOSIS — Z5321 Procedure and treatment not carried out due to patient leaving prior to being seen by health care provider: Secondary | ICD-10-CM | POA: Diagnosis not present

## 2021-04-25 DIAGNOSIS — M4317 Spondylolisthesis, lumbosacral region: Secondary | ICD-10-CM | POA: Diagnosis not present

## 2021-04-25 DIAGNOSIS — M5137 Other intervertebral disc degeneration, lumbosacral region: Secondary | ICD-10-CM | POA: Diagnosis not present

## 2021-04-25 DIAGNOSIS — R3911 Hesitancy of micturition: Secondary | ICD-10-CM | POA: Diagnosis not present

## 2021-04-25 DIAGNOSIS — E119 Type 2 diabetes mellitus without complications: Secondary | ICD-10-CM | POA: Diagnosis not present

## 2021-04-25 DIAGNOSIS — R1031 Right lower quadrant pain: Secondary | ICD-10-CM | POA: Diagnosis not present

## 2021-04-25 DIAGNOSIS — K7689 Other specified diseases of liver: Secondary | ICD-10-CM | POA: Diagnosis not present

## 2021-04-25 LAB — COMPREHENSIVE METABOLIC PANEL
ALT: 25 U/L (ref 0–44)
AST: 24 U/L (ref 15–41)
Albumin: 3.9 g/dL (ref 3.5–5.0)
Alkaline Phosphatase: 53 U/L (ref 38–126)
Anion gap: 11 (ref 5–15)
BUN: 17 mg/dL (ref 8–23)
CO2: 24 mmol/L (ref 22–32)
Calcium: 9.1 mg/dL (ref 8.9–10.3)
Chloride: 102 mmol/L (ref 98–111)
Creatinine, Ser: 1.03 mg/dL (ref 0.61–1.24)
GFR, Estimated: >60 mL/min (ref >60)
Glucose, Bld: 189 mg/dL — ABNORMAL HIGH (ref 70–99)
Potassium: 3.8 mmol/L (ref 3.5–5.1)
Sodium: 137 mmol/L (ref 135–145)
Total Bilirubin: 0.7 mg/dL (ref 0.3–1.2)
Total Protein: 6.8 g/dL (ref 6.5–8.1)

## 2021-04-25 LAB — CBC WITH DIFFERENTIAL/PLATELET
Abs Immature Granulocytes: 0.03 10*3/uL (ref 0.00–0.07)
Basophils Absolute: 0.1 10*3/uL (ref 0.0–0.1)
Basophils Relative: 0 %
Eosinophils Absolute: 0.1 10*3/uL (ref 0.0–0.5)
Eosinophils Relative: 1 %
HCT: 44.2 % (ref 39.0–52.0)
Hemoglobin: 15 g/dL (ref 13.0–17.0)
Immature Granulocytes: 0 %
Lymphocytes Relative: 34 %
Lymphs Abs: 4.5 10*3/uL — ABNORMAL HIGH (ref 0.7–4.0)
MCH: 30.5 pg (ref 26.0–34.0)
MCHC: 33.9 g/dL (ref 30.0–36.0)
MCV: 89.8 fL (ref 80.0–100.0)
Monocytes Absolute: 1 10*3/uL (ref 0.1–1.0)
Monocytes Relative: 7 %
Neutro Abs: 7.7 10*3/uL (ref 1.7–7.7)
Neutrophils Relative %: 58 %
Platelets: 203 10*3/uL (ref 150–400)
RBC: 4.92 MIL/uL (ref 4.22–5.81)
RDW: 13.1 % (ref 11.5–15.5)
WBC: 13.3 10*3/uL — ABNORMAL HIGH (ref 4.0–10.5)
nRBC: 0 % (ref 0.0–0.2)

## 2021-04-25 LAB — URINALYSIS, ROUTINE W REFLEX MICROSCOPIC
Bacteria, UA: NONE SEEN
Bilirubin Urine: NEGATIVE
Glucose, UA: NEGATIVE mg/dL
Ketones, ur: NEGATIVE mg/dL
Leukocytes,Ua: NEGATIVE
Nitrite: NEGATIVE
Protein, ur: 30 mg/dL — AB
Specific Gravity, Urine: 1.017 (ref 1.005–1.030)
pH: 5 (ref 5.0–8.0)

## 2021-04-25 MED ORDER — OXYCODONE-ACETAMINOPHEN 5-325 MG PO TABS
1.0000 | ORAL_TABLET | Freq: Once | ORAL | Status: AC
  Administered 2021-04-25: 1 via ORAL
  Filled 2021-04-25: qty 1

## 2021-04-25 MED ORDER — ONDANSETRON 4 MG PO TBDP
4.0000 mg | ORAL_TABLET | Freq: Once | ORAL | Status: AC
  Administered 2021-04-25: 4 mg via ORAL
  Filled 2021-04-25: qty 1

## 2021-04-25 NOTE — ED Triage Notes (Signed)
Pt c/o RLQ pain that started 20 minutes pta. Pt states this morning his urine was darker than normal, saw his PCP and was told he had blood in his urine. Endorses some nausea, denies vomiting/diarrhea.

## 2021-04-25 NOTE — ED Provider Notes (Signed)
Emergency Medicine Provider Triage Evaluation Note  Cory Walls , a 75 y.o. male  was evaluated in triage.  Pt complains of sudden onset, constant, sharp, right lower quadrant pain that began 20 minutes prior to arrival.  He reports that he noticed darker urine this morning.  He went to his PCP and was told that he had blood in his urine.  He was planning to have him follow-up with the urologist however when he began having pain he decided to come to the ED for further evaluation.  He denies any nausea, vomiting, diarrhea.  Patient believes he has had his appendix out.  He states that he was told by his mother that he had it out when he was 6 on top of a hernia repair however he is unsure.  He also includes cholecystectomy past surgical history.  Denies history of kidney stones..  Review of Systems  Positive: + RLQ pain, hematuria Negative: - vomiting, diarrhea  Physical Exam  BP (!) 154/82 (BP Location: Left Arm)   Pulse (!) 56   Temp 97.7 F (36.5 C) (Oral)   Resp (!) 24   SpO2 100%  Gen:   Awake, no distress   Resp:  Normal effort  MSK:   Moves extremities without difficulty  Other:  + RLQ abd TTP  Medical Decision Making  Medically screening exam initiated at 4:10 PM.  Appropriate orders placed.  Cory Walls was informed that the remainder of the evaluation will be completed by another provider, this initial triage assessment does not replace that evaluation, and the importance of remaining in the ED until their evaluation is complete.     Eustaquio Maize, PA-C 04/25/21 [place], MD 04/26/21 [phone]

## 2021-04-25 NOTE — ED Notes (Signed)
Patient states he called his PCP and his doc sent medicine to the pharmacy. Patient states he is leaving

## 2021-04-28 DIAGNOSIS — N202 Calculus of kidney with calculus of ureter: Secondary | ICD-10-CM | POA: Diagnosis not present

## 2021-04-28 DIAGNOSIS — N132 Hydronephrosis with renal and ureteral calculous obstruction: Secondary | ICD-10-CM | POA: Diagnosis not present

## 2021-04-28 DIAGNOSIS — R1084 Generalized abdominal pain: Secondary | ICD-10-CM | POA: Diagnosis not present

## 2021-05-05 ENCOUNTER — Encounter: Payer: Self-pay | Admitting: Podiatry

## 2021-05-05 ENCOUNTER — Ambulatory Visit (INDEPENDENT_AMBULATORY_CARE_PROVIDER_SITE_OTHER): Payer: Medicare Other | Admitting: Podiatry

## 2021-05-05 ENCOUNTER — Other Ambulatory Visit: Payer: Self-pay

## 2021-05-05 DIAGNOSIS — M722 Plantar fascial fibromatosis: Secondary | ICD-10-CM | POA: Diagnosis not present

## 2021-05-05 NOTE — Progress Notes (Signed)
Subjective:   Patient ID: Cory Walls, male   DOB: 75 y.o.   MRN: [id number]   HPI Patient states he has had significant improvement of the pain in the bottom of the heel arch right with only mild discomfort knowing that he has bad foot structure   ROS      Objective:  Physical Exam  Neurovascular status intact with patient right plantar heel and into the arch inflamed but much better than it was with improvement in the pathology     Assessment:  Doing well post fascial inflammation right     Plan:  H&P advised on anti-inflammatories physical therapy and continue shoe gear modifications all discussed today and reappoint as symptoms indicate but at this point doing well

## 2021-05-07 DIAGNOSIS — S8001XA Contusion of right knee, initial encounter: Secondary | ICD-10-CM | POA: Diagnosis not present

## 2021-05-10 DIAGNOSIS — S63502A Unspecified sprain of left wrist, initial encounter: Secondary | ICD-10-CM | POA: Diagnosis not present

## 2021-05-10 DIAGNOSIS — S46012A Strain of muscle(s) and tendon(s) of the rotator cuff of left shoulder, initial encounter: Secondary | ICD-10-CM | POA: Diagnosis not present

## 2021-05-14 DIAGNOSIS — R1084 Generalized abdominal pain: Secondary | ICD-10-CM | POA: Diagnosis not present

## 2021-05-14 DIAGNOSIS — N201 Calculus of ureter: Secondary | ICD-10-CM | POA: Diagnosis not present

## 2021-05-15 DIAGNOSIS — M25512 Pain in left shoulder: Secondary | ICD-10-CM | POA: Diagnosis not present

## 2021-05-21 ENCOUNTER — Ambulatory Visit: Payer: Medicare Other | Admitting: Podiatry

## 2021-05-21 DIAGNOSIS — L814 Other melanin hyperpigmentation: Secondary | ICD-10-CM | POA: Diagnosis not present

## 2021-05-21 DIAGNOSIS — D225 Melanocytic nevi of trunk: Secondary | ICD-10-CM | POA: Diagnosis not present

## 2021-05-21 DIAGNOSIS — S46012A Strain of muscle(s) and tendon(s) of the rotator cuff of left shoulder, initial encounter: Secondary | ICD-10-CM | POA: Diagnosis not present

## 2021-05-21 DIAGNOSIS — Z23 Encounter for immunization: Secondary | ICD-10-CM | POA: Diagnosis not present

## 2021-05-21 DIAGNOSIS — Z86018 Personal history of other benign neoplasm: Secondary | ICD-10-CM | POA: Diagnosis not present

## 2021-05-21 DIAGNOSIS — L578 Other skin changes due to chronic exposure to nonionizing radiation: Secondary | ICD-10-CM | POA: Diagnosis not present

## 2021-05-21 DIAGNOSIS — D1801 Hemangioma of skin and subcutaneous tissue: Secondary | ICD-10-CM | POA: Diagnosis not present

## 2021-05-21 DIAGNOSIS — L821 Other seborrheic keratosis: Secondary | ICD-10-CM | POA: Diagnosis not present

## 2021-05-30 DIAGNOSIS — U071 COVID-19: Secondary | ICD-10-CM | POA: Diagnosis not present

## 2021-05-30 NOTE — Progress Notes (Addendum)
COVID swab appointment: n/a  COVID Vaccine Completed: yes x2 Date COVID Vaccine completed: 07/08/19, 07/29/19 Has received booster: COVID vaccine manufacturer: Pfizer      Date of COVID positive in last 90 days: no  PCP - Leanna Battles, MD Cardiologist - n/a  Chest x-ray - n/a EKG - 06/04/21 Epic/chart Stress Test - years ago per pt ECHO - n/a Cardiac Cath - n/a Pacemaker/ICD device last checked: n/a Spinal Cord Stimulator: n/a  Sleep Study - n/a CPAP -   Fasting Blood Sugar - no sugar checks at home per pt Checks Blood Sugar __ times a day  Blood Thinner Instructions: Aspirin Instructions: ASA 81, hold 7 days Last Dose:  Activity level: Can go up a flight of stairs and perform activities of daily living without stopping and without symptoms of chest pain or shortness of breath.     Anesthesia review: A1C 7.6 at PAT appointment, DM2  Patient denies shortness of breath, fever, cough and chest pain at PAT appointment   Patient verbalized understanding of instructions that were given to them at the PAT appointment. Patient was also instructed that they will need to review over the PAT instructions again at home before surgery.

## 2021-06-02 DIAGNOSIS — M75102 Unspecified rotator cuff tear or rupture of left shoulder, not specified as traumatic: Secondary | ICD-10-CM | POA: Diagnosis present

## 2021-06-02 NOTE — Progress Notes (Signed)
Please place orders for PAT appointment scheduled 06/04/21.

## 2021-06-02 NOTE — Patient Instructions (Addendum)
DUE TO COVID-19 ONLY ONE VISITOR IS ALLOWED TO COME WITH YOU AND STAY IN THE WAITING ROOM ONLY DURING PRE OP AND PROCEDURE.   **NO VISITORS ARE ALLOWED IN THE SHORT STAY AREA OR RECOVERY ROOM!!**       Your procedure is scheduled on: 06/17/21   Report to [facility] Main Entrance    Report to admitting at 7:15 AM   Call this number if you have problems the morning of surgery [phone]   Do not eat food :After Midnight.   May have liquids until 7:00 AM day of surgery  CLEAR LIQUID DIET  Foods Allowed                                                                     Foods Excluded  Water, Black Coffee and tea (no milk or creamer)           liquids that you cannot  Plain Jell-O in any flavor  (No red)                                   see through such as: Fruit ices (not with fruit pulp)                                           milk, soups, orange juice              Iced Popsicles (No red)                                              All solid food                                   Apple juices Sports drinks like Gatorade (No red) Lightly seasoned clear broth or consume(fat free) Sugar     The day of surgery:  Drink ONE (1) Pre-Surgery G2 by 7:00 am the morning of surgery. Drink in one sitting. Do not sip.  This drink was given to you during your hospital  pre-op appointment visit. Nothing else to drink after completing the  Pre-Surgery G2.          If you have questions, please contact your surgeons office.     Oral Hygiene is also important to reduce your risk of infection.                                    Remember - BRUSH YOUR TEETH THE MORNING OF SURGERY WITH YOUR REGULAR TOOTHPASTE   Do NOT smoke after Midnight   Take these medicines the morning of surgery with A SIP OF WATER: Tylenol  DO NOT TAKE ANY ORAL DIABETIC MEDICATIONS DAY OF YOUR SURGERY  How to Manage Your Diabetes Before and After Surgery  Why is it important to control  my blood sugar  before and after surgery? Improving blood sugar levels before and after surgery helps healing and can limit problems. A way of improving blood sugar control is eating a healthy diet by:  Eating less sugar and carbohydrates  Increasing activity/exercise  Talking with your doctor about reaching your blood sugar goals High blood sugars (greater than 180 mg/dL) can raise your risk of infections and slow your recovery, so you will need to focus on controlling your diabetes during the weeks before surgery. Make sure that the doctor who takes care of your diabetes knows about your planned surgery including the date and location.  How do I manage my blood sugar before surgery? Check your blood sugar at least 4 times a day, starting 2 days before surgery, to make sure that the level is not too high or low. Check your blood sugar the morning of your surgery when you wake up and every 2 hours until you get to the Short Stay unit. If your blood sugar is less than 70 mg/dL, you will need to treat for low blood sugar: Do not take insulin. Treat a low blood sugar (less than 70 mg/dL) with  cup of clear juice (cranberry or apple), 4 glucose tablets, OR glucose gel. Recheck blood sugar in 15 minutes after treatment (to make sure it is greater than 70 mg/dL). If your blood sugar is not greater than 70 mg/dL on recheck, call [phone] for further instructions. Report your blood sugar to the short stay nurse when you get to Short Stay.  If you are admitted to the hospital after surgery: Your blood sugar will be checked by the staff and you will probably be given insulin after surgery (instead of oral diabetes medicines) to make sure you have good blood sugar levels. The goal for blood sugar control after surgery is 80-180 mg/dL.   WHAT DO I DO ABOUT MY DIABETES MEDICATION?  Do not take oral diabetes medicines (pills) the morning of surgery.  THE DAY BEFORE SURGERY, take Metformin as prescribed      THE  MORNING OF SURGERY, do not take Metformin.   Reviewed and Endorsed by [facility] Patient Education Committee, August 2015                               You may not have any metal on your body including jewelry, and body piercing             Do not wear lotions, powders, cologne, or deodorant              Men may shave face and neck.   Do not bring valuables to the hospital. [place].   Contacts, dentures or bridgework may not be worn into surgery.   Patients discharged on the day of surgery will not be allowed to drive home.              Please read over the following fact sheets you were given: IF YOU HAVE QUESTIONS ABOUT YOUR PRE-OP INSTRUCTIONS PLEASE CALL [facility] - Preparing for Surgery Before surgery, you can play an important role.  Because skin is not sterile, your skin needs to be as free of germs as possible.  You can reduce the number of germs on your skin by washing with CHG (  chlorahexidine gluconate) soap before surgery.  CHG is an antiseptic cleaner which kills germs and bonds with the skin to continue killing germs even after washing. Please DO NOT use if you have an allergy to CHG or antibacterial soaps.  If your skin becomes reddened/irritated stop using the CHG and inform your nurse when you arrive at Short Stay. Do not shave (including legs and underarms) for at least 48 hours prior to the first CHG shower.  You may shave your face/neck.  Please follow these instructions carefully:  1.  Shower with CHG Soap the night before surgery and the  morning of surgery.  2.  If you choose to wash your hair, wash your hair first as usual with your normal  shampoo.  3.  After you shampoo, rinse your hair and body thoroughly to remove the shampoo.                             4.  Use CHG as you would any other liquid soap.  You can apply chg directly to the skin and wash.  Gently with a scrungie or clean  washcloth.  5.  Apply the CHG Soap to your body ONLY FROM THE NECK DOWN.   Do   not use on face/ open                           Wound or open sores. Avoid contact with eyes, ears mouth and   genitals (private parts).                       Wash face,  Genitals (private parts) with your normal soap.             6.  Wash thoroughly, paying special attention to the area where your    surgery  will be performed.  7.  Thoroughly rinse your body with warm water from the neck down.  8.  DO NOT shower/wash with your normal soap after using and rinsing off the CHG Soap.                9.  Pat yourself dry with a clean towel.            10.  Wear clean pajamas.            11.  Place clean sheets on your bed the night of your first shower and do not  sleep with pets. Day of Surgery : Do not apply any lotions/deodorants the morning of surgery.  Please wear clean clothes to the hospital/surgery center.  FAILURE TO FOLLOW THESE INSTRUCTIONS MAY RESULT IN THE CANCELLATION OF YOUR SURGERY  PATIENT SIGNATURE_________________________________  NURSE SIGNATURE__________________________________  ________________________________________________________________________    [facility]- Preparing for Total Shoulder Arthroplasty    Before surgery, you can play an important role. Because skin is not sterile, your skin needs to be as free of germs as possible. You can reduce the number of germs on your skin by using the following products. Benzoyl Peroxide Gel Reduces the number of germs present on the skin Applied twice a day to shoulder area starting two days before surgery    ==================================================================  Please follow these instructions carefully:  BENZOYL PEROXIDE 5% GEL  Please do not use if you have an allergy to benzoyl peroxide.   If your skin becomes reddened/irritated stop using the benzoyl peroxide.  Starting  two days before surgery, apply as follows: Apply  benzoyl peroxide in the morning and at night. Apply after taking a shower. If you are not taking a shower clean entire shoulder front, back, and side along with the armpit with a clean wet washcloth.  Place a quarter-sized dollop on your shoulder and rub in thoroughly, making sure to cover the front, back, and side of your shoulder, along with the armpit.   2 days before ____ AM   ____ PM              1 day before ____ AM   ____ PM                         Do this twice a day for two days.  (Last application is the night before surgery, AFTER using the CHG soap as described below).  Do NOT apply benzoyl peroxide gel on the day of surgery.

## 2021-06-03 DIAGNOSIS — M25561 Pain in right knee: Secondary | ICD-10-CM | POA: Diagnosis not present

## 2021-06-04 ENCOUNTER — Encounter (HOSPITAL_COMMUNITY)
Admission: RE | Admit: 2021-06-04 | Discharge: 2021-06-04 | Disposition: A | Discharge status: Home / Self Care | Payer: Medicare Other | Source: Ambulatory Visit | Attending: Orthopedic Surgery | Admitting: Orthopedic Surgery

## 2021-06-04 ENCOUNTER — Encounter (HOSPITAL_COMMUNITY): Payer: Self-pay

## 2021-06-04 ENCOUNTER — Other Ambulatory Visit: Payer: Self-pay

## 2021-06-04 VITALS — BP 130/71 | HR 65 | Temp 97.8°F | Resp 18 | Ht 72.0 in | Wt 204.8 lb

## 2021-06-04 DIAGNOSIS — E119 Type 2 diabetes mellitus without complications: Secondary | ICD-10-CM | POA: Insufficient documentation

## 2021-06-04 DIAGNOSIS — Z01818 Encounter for other preprocedural examination: Secondary | ICD-10-CM | POA: Insufficient documentation

## 2021-06-04 HISTORY — DX: Personal history of urinary calculi: Z87.442

## 2021-06-04 LAB — BASIC METABOLIC PANEL
Anion gap: 7 (ref 5–15)
BUN: 18 mg/dL (ref 8–23)
CO2: 28 mmol/L (ref 22–32)
Calcium: 9.2 mg/dL (ref 8.9–10.3)
Chloride: 102 mmol/L (ref 98–111)
Creatinine, Ser: 0.82 mg/dL (ref 0.61–1.24)
GFR, Estimated: >60 mL/min (ref >60)
Glucose, Bld: 180 mg/dL — ABNORMAL HIGH (ref 70–99)
Potassium: 4.2 mmol/L (ref 3.5–5.1)
Sodium: 137 mmol/L (ref 135–145)

## 2021-06-04 LAB — CBC
HCT: 44.4 % (ref 39.0–52.0)
Hemoglobin: 15 g/dL (ref 13.0–17.0)
MCH: 30.4 pg (ref 26.0–34.0)
MCHC: 33.8 g/dL (ref 30.0–36.0)
MCV: 89.9 fL (ref 80.0–100.0)
Platelets: 193 10*3/uL (ref 150–400)
RBC: 4.94 MIL/uL (ref 4.22–5.81)
RDW: 13.2 % (ref 11.5–15.5)
WBC: 13.1 10*3/uL — ABNORMAL HIGH (ref 4.0–10.5)
nRBC: 0 % (ref 0.0–0.2)

## 2021-06-04 LAB — HEMOGLOBIN A1C
Hgb A1c MFr Bld: 7.6 % — ABNORMAL HIGH (ref 4.8–5.6)
Mean Plasma Glucose: 171.42 mg/dL

## 2021-06-04 LAB — SURGICAL PCR SCREEN
MRSA, PCR: NEGATIVE
Staphylococcus aureus: NEGATIVE

## 2021-06-04 LAB — GLUCOSE, CAPILLARY: Glucose-Capillary: 185 mg/dL — ABNORMAL HIGH (ref 70–99)

## 2021-06-04 NOTE — Progress Notes (Signed)
A1C 7.6. Results routed to Dr. Mardelle Matte.

## 2021-06-05 DIAGNOSIS — M25561 Pain in right knee: Secondary | ICD-10-CM | POA: Diagnosis not present

## 2021-06-16 NOTE — Anesthesia Preprocedure Evaluation (Addendum)
Anesthesia Evaluation  Patient identified by MRN, date of birth, ID band Patient awake    Reviewed: Allergy & Precautions, NPO status , Patient's Chart, lab work & pertinent test results  Airway Mallampati: III  TM Distance: >3 FB Neck ROM: Full    Dental no notable dental hx. (+) Teeth Intact, Dental Advisory Given   Pulmonary Current Smoker and Patient abstained from smoking.,    Pulmonary exam normal breath sounds clear to auscultation       Cardiovascular hypertension, Pt. on medications Normal cardiovascular exam Rhythm:Regular Rate:Normal     Neuro/Psych negative neurological ROS  negative psych ROS   GI/Hepatic negative GI ROS, Neg liver ROS,   Endo/Other  diabetes, Type 2, Oral Hypoglycemic Agents  Renal/GU negative Renal ROS  negative genitourinary   Musculoskeletal  (+) Arthritis ,   Abdominal   Peds  Hematology negative hematology ROS (+)   Anesthesia Other Findings   Reproductive/Obstetrics                            Anesthesia Physical Anesthesia Plan  ASA: 2  Anesthesia Plan: General and Regional   Post-op Pain Management: Regional block and Tylenol PO (pre-op)   Induction: Intravenous  PONV Risk Score and Plan: 1 and Dexamethasone and Ondansetron  Airway Management Planned: Oral ETT  Additional Equipment:   Intra-op Plan:   Post-operative Plan: Extubation in OR  Informed Consent: I have reviewed the patients History and Physical, chart, labs and discussed the procedure including the risks, benefits and alternatives for the proposed anesthesia with the patient or authorized representative who has indicated his/her understanding and acceptance.     Dental advisory given  Plan Discussed with: CRNA  Anesthesia Plan Comments:         Anesthesia Quick Evaluation

## 2021-06-17 ENCOUNTER — Ambulatory Visit (HOSPITAL_COMMUNITY): Payer: Medicare Other | Admitting: Physician Assistant

## 2021-06-17 ENCOUNTER — Ambulatory Visit (HOSPITAL_COMMUNITY): Payer: Medicare Other | Admitting: Anesthesiology

## 2021-06-17 ENCOUNTER — Ambulatory Visit (HOSPITAL_COMMUNITY)
Admission: RE | Admit: 2021-06-17 | Discharge: 2021-06-17 | Disposition: A | Discharge status: Home / Self Care | Payer: Medicare Other | Attending: Orthopedic Surgery | Admitting: Orthopedic Surgery

## 2021-06-17 ENCOUNTER — Encounter (HOSPITAL_COMMUNITY)
Admission: RE | Disposition: A | Discharge status: Home / Self Care | Payer: Self-pay | Source: Home / Self Care | Attending: Orthopedic Surgery

## 2021-06-17 ENCOUNTER — Ambulatory Visit (HOSPITAL_COMMUNITY): Payer: Medicare Other

## 2021-06-17 ENCOUNTER — Ambulatory Visit (HOSPITAL_COMMUNITY)
Admission: RE | Admit: 2021-06-17 | Discharge: 2021-06-17 | Disposition: A | Discharge status: Home / Self Care | Payer: Medicare Other | Source: Ambulatory Visit | Attending: Orthopedic Surgery | Admitting: Orthopedic Surgery

## 2021-06-17 ENCOUNTER — Encounter (HOSPITAL_COMMUNITY): Payer: Self-pay | Admitting: Orthopedic Surgery

## 2021-06-17 DIAGNOSIS — X58XXXA Exposure to other specified factors, initial encounter: Secondary | ICD-10-CM | POA: Diagnosis not present

## 2021-06-17 DIAGNOSIS — M7989 Other specified soft tissue disorders: Secondary | ICD-10-CM | POA: Diagnosis not present

## 2021-06-17 DIAGNOSIS — Y939 Activity, unspecified: Secondary | ICD-10-CM | POA: Insufficient documentation

## 2021-06-17 DIAGNOSIS — Z96611 Presence of right artificial shoulder joint: Secondary | ICD-10-CM | POA: Diagnosis not present

## 2021-06-17 DIAGNOSIS — E119 Type 2 diabetes mellitus without complications: Secondary | ICD-10-CM | POA: Diagnosis not present

## 2021-06-17 DIAGNOSIS — Z7984 Long term (current) use of oral hypoglycemic drugs: Secondary | ICD-10-CM | POA: Diagnosis not present

## 2021-06-17 DIAGNOSIS — S46012A Strain of muscle(s) and tendon(s) of the rotator cuff of left shoulder, initial encounter: Secondary | ICD-10-CM | POA: Insufficient documentation

## 2021-06-17 DIAGNOSIS — F17210 Nicotine dependence, cigarettes, uncomplicated: Secondary | ICD-10-CM | POA: Insufficient documentation

## 2021-06-17 DIAGNOSIS — Z471 Aftercare following joint replacement surgery: Secondary | ICD-10-CM | POA: Diagnosis not present

## 2021-06-17 DIAGNOSIS — M75102 Unspecified rotator cuff tear or rupture of left shoulder, not specified as traumatic: Secondary | ICD-10-CM | POA: Diagnosis present

## 2021-06-17 DIAGNOSIS — M19011 Primary osteoarthritis, right shoulder: Secondary | ICD-10-CM | POA: Diagnosis not present

## 2021-06-17 DIAGNOSIS — M19012 Primary osteoarthritis, left shoulder: Secondary | ICD-10-CM | POA: Diagnosis not present

## 2021-06-17 DIAGNOSIS — F17290 Nicotine dependence, other tobacco product, uncomplicated: Secondary | ICD-10-CM | POA: Insufficient documentation

## 2021-06-17 DIAGNOSIS — Z96612 Presence of left artificial shoulder joint: Secondary | ICD-10-CM

## 2021-06-17 DIAGNOSIS — G8918 Other acute postprocedural pain: Secondary | ICD-10-CM | POA: Diagnosis not present

## 2021-06-17 HISTORY — PX: REVERSE SHOULDER ARTHROPLASTY: SHX5054

## 2021-06-17 LAB — GLUCOSE, CAPILLARY
Glucose-Capillary: 151 mg/dL — ABNORMAL HIGH (ref 70–99)
Glucose-Capillary: 164 mg/dL — ABNORMAL HIGH (ref 70–99)

## 2021-06-17 SURGERY — ARTHROPLASTY, SHOULDER, TOTAL, REVERSE
Anesthesia: Regional | Site: Shoulder | Laterality: Left

## 2021-06-17 MED ORDER — LIDOCAINE 2% (20 MG/ML) 5 ML SYRINGE
INTRAMUSCULAR | Status: DC | PRN
  Administered 2021-06-17: 60 mg via INTRAVENOUS

## 2021-06-17 MED ORDER — MIDAZOLAM HCL 2 MG/2ML IJ SOLN
2.0000 mg | Freq: Once | INTRAMUSCULAR | Status: AC
  Administered 2021-06-17: 2 mg via INTRAVENOUS
  Filled 2021-06-17: qty 2

## 2021-06-17 MED ORDER — FENTANYL CITRATE PF 50 MCG/ML IJ SOSY
PREFILLED_SYRINGE | INTRAMUSCULAR | Status: AC
  Filled 2021-06-17: qty 2

## 2021-06-17 MED ORDER — FENTANYL CITRATE PF 50 MCG/ML IJ SOSY
25.0000 ug | PREFILLED_SYRINGE | INTRAMUSCULAR | Status: DC | PRN
  Administered 2021-06-17: 25 ug via INTRAVENOUS

## 2021-06-17 MED ORDER — FENTANYL CITRATE PF 50 MCG/ML IJ SOSY
100.0000 ug | PREFILLED_SYRINGE | Freq: Once | INTRAMUSCULAR | Status: AC
  Administered 2021-06-17: 50 ug via INTRAVENOUS
  Filled 2021-06-17: qty 2

## 2021-06-17 MED ORDER — EPHEDRINE SULFATE-NACL 50-0.9 MG/10ML-% IV SOSY
PREFILLED_SYRINGE | INTRAVENOUS | Status: DC | PRN
  Administered 2021-06-17: 10 mg via INTRAVENOUS
  Administered 2021-06-17 (×2): 5 mg via INTRAVENOUS
  Administered 2021-06-17: 10 mg via INTRAVENOUS

## 2021-06-17 MED ORDER — CEFAZOLIN SODIUM-DEXTROSE 2-4 GM/100ML-% IV SOLN
2.0000 g | INTRAVENOUS | Status: AC
  Administered 2021-06-17: 2 g via INTRAVENOUS
  Filled 2021-06-17: qty 100

## 2021-06-17 MED ORDER — SODIUM CHLORIDE 0.9 % IR SOLN
Status: DC | PRN
  Administered 2021-06-17: 1000 mL

## 2021-06-17 MED ORDER — METHOCARBAMOL 500 MG PO TABS: 500.0000 mg | ORAL_TABLET | Freq: Four times a day (QID) | ORAL | Status: DC | PRN

## 2021-06-17 MED ORDER — DEXAMETHASONE SODIUM PHOSPHATE 10 MG/ML IJ SOLN
INTRAMUSCULAR | Status: AC
  Filled 2021-06-17: qty 1

## 2021-06-17 MED ORDER — METHOCARBAMOL 500 MG IVPB - SIMPLE MED: 500.0000 mg | Freq: Four times a day (QID) | INTRAVENOUS | Status: DC | PRN

## 2021-06-17 MED ORDER — ONDANSETRON HCL 4 MG/2ML IJ SOLN
INTRAMUSCULAR | Status: DC | PRN
Start: 2021-06-17 — End: 2021-06-17
  Administered 2021-06-17: 4 mg via INTRAVENOUS

## 2021-06-17 MED ORDER — PHENYLEPHRINE HCL (PRESSORS) 10 MG/ML IV SOLN
INTRAVENOUS | Status: AC
  Filled 2021-06-17: qty 1

## 2021-06-17 MED ORDER — LACTATED RINGERS IV SOLN: INTRAVENOUS | Status: DC

## 2021-06-17 MED ORDER — TRANEXAMIC ACID-NACL 1000-0.7 MG/100ML-% IV SOLN
1000.0000 mg | INTRAVENOUS | Status: AC
  Administered 2021-06-17: 1000 mg via INTRAVENOUS
  Filled 2021-06-17: qty 100

## 2021-06-17 MED ORDER — LACTATED RINGERS IV BOLUS: 250.0000 mL | Freq: Once | INTRAVENOUS | Status: DC

## 2021-06-17 MED ORDER — FENTANYL CITRATE (PF) 100 MCG/2ML IJ SOLN
INTRAMUSCULAR | Status: DC | PRN
  Administered 2021-06-17: 50 ug via INTRAVENOUS

## 2021-06-17 MED ORDER — LIDOCAINE HCL (PF) 2 % IJ SOLN
INTRAMUSCULAR | Status: AC
  Filled 2021-06-17: qty 5

## 2021-06-17 MED ORDER — DEXAMETHASONE SODIUM PHOSPHATE 4 MG/ML IJ SOLN
INTRAMUSCULAR | Status: DC | PRN
  Administered 2021-06-17: 4 mg via INTRAVENOUS

## 2021-06-17 MED ORDER — PROPOFOL 10 MG/ML IV BOLUS
INTRAVENOUS | Status: AC
  Filled 2021-06-17: qty 20

## 2021-06-17 MED ORDER — LACTATED RINGERS IV BOLUS
500.0000 mL | Freq: Once | INTRAVENOUS | Status: AC
  Administered 2021-06-17: 500 mL via INTRAVENOUS

## 2021-06-17 MED ORDER — ONDANSETRON HCL 4 MG/2ML IJ SOLN
INTRAMUSCULAR | Status: AC
  Filled 2021-06-17: qty 2

## 2021-06-17 MED ORDER — METHOCARBAMOL 500 MG IVPB - SIMPLE MED
INTRAVENOUS | Status: AC
  Administered 2021-06-17: 500 mg via INTRAVENOUS
  Filled 2021-06-17: qty 50

## 2021-06-17 MED ORDER — ACETAMINOPHEN 500 MG PO TABS
1000.0000 mg | ORAL_TABLET | Freq: Once | ORAL | Status: AC
  Administered 2021-06-17: 1000 mg via ORAL

## 2021-06-17 MED ORDER — SUGAMMADEX SODIUM 200 MG/2ML IV SOLN
INTRAVENOUS | Status: DC | PRN
  Administered 2021-06-17: 200 mg via INTRAVENOUS

## 2021-06-17 MED ORDER — ONDANSETRON HCL 4 MG PO TABS: 4.0000 mg | ORAL_TABLET | Freq: Three times a day (TID) | ORAL | 0 refills | Status: DC | PRN

## 2021-06-17 MED ORDER — EPHEDRINE 5 MG/ML INJ
INTRAVENOUS | Status: AC
  Filled 2021-06-17: qty 5

## 2021-06-17 MED ORDER — ROCURONIUM BROMIDE 100 MG/10ML IV SOLN
INTRAVENOUS | Status: DC | PRN
  Administered 2021-06-17: 100 mg via INTRAVENOUS

## 2021-06-17 MED ORDER — POVIDONE-IODINE 10 % EX SWAB
2.0000 "application " | Freq: Once | CUTANEOUS | Status: AC
  Administered 2021-06-17: 2 via TOPICAL

## 2021-06-17 MED ORDER — PHENYLEPHRINE HCL-NACL 20-0.9 MG/250ML-% IV SOLN
INTRAVENOUS | Status: DC | PRN
Start: 2021-06-17 — End: 2021-06-17
  Administered 2021-06-17: 50 ug/min via INTRAVENOUS

## 2021-06-17 MED ORDER — HYDROMORPHONE HCL 2 MG PO TABS: 2.0000 mg | ORAL_TABLET | Freq: Four times a day (QID) | ORAL | 0 refills | Status: AC | PRN

## 2021-06-17 MED ORDER — PROPOFOL 10 MG/ML IV BOLUS
INTRAVENOUS | Status: DC | PRN
  Administered 2021-06-17: 150 mg via INTRAVENOUS
  Administered 2021-06-17: 50 mg via INTRAVENOUS

## 2021-06-17 MED ORDER — ROCURONIUM BROMIDE 10 MG/ML (PF) SYRINGE
PREFILLED_SYRINGE | INTRAVENOUS | Status: AC
  Filled 2021-06-17: qty 10

## 2021-06-17 MED ORDER — CEFAZOLIN SODIUM-DEXTROSE 2-4 GM/100ML-% IV SOLN: 2.0000 g | Freq: Four times a day (QID) | INTRAVENOUS | Status: DC

## 2021-06-17 MED ORDER — TRANEXAMIC ACID-NACL 1000-0.7 MG/100ML-% IV SOLN: 1000.0000 mg | Freq: Once | INTRAVENOUS | Status: DC

## 2021-06-17 MED ORDER — FENTANYL CITRATE (PF) 100 MCG/2ML IJ SOLN
INTRAMUSCULAR | Status: AC
  Filled 2021-06-17: qty 2

## 2021-06-17 MED ORDER — EPHEDRINE 5 MG/ML INJ
INTRAVENOUS | Status: AC
  Filled 2021-06-17: qty 10

## 2021-06-17 MED ORDER — SENNA-DOCUSATE SODIUM 8.6-50 MG PO TABS: 2.0000 | ORAL_TABLET | Freq: Every day | ORAL | 1 refills | Status: DC

## 2021-06-17 MED ORDER — ACETAMINOPHEN 500 MG PO TABS
1000.0000 mg | ORAL_TABLET | Freq: Once | ORAL | Status: DC
  Filled 2021-06-17: qty 2

## 2021-06-17 SURGICAL SUPPLY — 66 items
AID PSTN UNV HD RSTRNT DISP (MISCELLANEOUS) ×1
BAG COUNTER SPONGE SURGICOUNT (BAG) ×1 IMPLANT
BAG SPEC THK2 15X12 ZIP CLS (MISCELLANEOUS) ×1
BAG SPNG CNTER NS LX DISP (BAG) ×1
BAG ZIPLOCK 12X15 (MISCELLANEOUS) ×2 IMPLANT
BASEPLATE GLENOSPHERE 25 (Plate) ×1 IMPLANT
BEARING CROSSLINK RSA 36 (Joint) ×1 IMPLANT
BIT DRILL TWIST 2.7 (BIT) ×1 IMPLANT
BLADE SAW SAG 73X25 THK (BLADE) ×1
BLADE SAW SGTL 73X25 THK (BLADE) ×1 IMPLANT
BOOTIES KNEE HIGH SLOAN (MISCELLANEOUS) ×1 IMPLANT
BOWL SMART MIX CTS (DISPOSABLE) IMPLANT
BRNG HUM STD 36 RVRS SHDR PRLG (Joint) ×1 IMPLANT
CLSR STERI-STRIP ANTIMIC 1/2X4 (GAUZE/BANDAGES/DRESSINGS) ×2 IMPLANT
COVER BACK TABLE 60X90IN (DRAPES) ×2 IMPLANT
COVER MAYO STAND STRL (DRAPES) ×2 IMPLANT
COVER SURGICAL LIGHT HANDLE (MISCELLANEOUS) ×2 IMPLANT
DECANTER SPIKE VIAL GLASS SM (MISCELLANEOUS) IMPLANT
DRAPE ORTHO SPLIT 77X108 STRL (DRAPES) ×2
DRAPE SHEET LG 3/4 BI-LAMINATE (DRAPES) ×4 IMPLANT
DRAPE SURG 17X11 SM STRL (DRAPES) ×2 IMPLANT
DRAPE SURG ORHT 6 SPLT 77X108 (DRAPES) IMPLANT
DRAPE U-SHAPE 47X51 STRL (DRAPES) ×3 IMPLANT
DRSG AQUACEL AG ADV 3.5X 6 (GAUZE/BANDAGES/DRESSINGS) ×1 IMPLANT
DRSG MEPILEX BORDER 4X8 (GAUZE/BANDAGES/DRESSINGS) ×1 IMPLANT
DURAPREP 26ML APPLICATOR (WOUND CARE) ×4 IMPLANT
ELECT BLADE INSULATED 4IN (ELECTROSURGICAL) ×2
ELECT REM PT RETURN 15FT ADLT (MISCELLANEOUS) ×2 IMPLANT
ELECTRODE BLADE INSULATED 4IN (ELECTROSURGICAL) IMPLANT
GLENOID SPHERE STD STRL 36MM (Orthopedic Implant) ×1 IMPLANT
GLOVE SRG 8 PF TXTR STRL LF DI (GLOVE) ×1 IMPLANT
GLOVE SURG ENC MOIS LTX SZ7 (GLOVE) ×2 IMPLANT
GLOVE SURG ORTHO LTX SZ7.5 (GLOVE) ×2 IMPLANT
GLOVE SURG UNDER POLY LF SZ7 (GLOVE) ×2 IMPLANT
GLOVE SURG UNDER POLY LF SZ8 (GLOVE) ×2
GOWN STRL REUS W/TWL 2XL LVL3 (GOWN DISPOSABLE) ×2 IMPLANT
GOWN STRL REUS W/TWL LRG LVL3 (GOWN DISPOSABLE) ×2 IMPLANT
HOOD PEEL AWAY FLYTE STAYCOOL (MISCELLANEOUS) ×5 IMPLANT
KIT BASIN OR (CUSTOM PROCEDURE TRAY) ×2 IMPLANT
KIT TURNOVER KIT A (KITS) IMPLANT
PACK SHOULDER (CUSTOM PROCEDURE TRAY) ×2 IMPLANT
PAD COLD SHLDR WRAP-ON (PAD) ×1 IMPLANT
PIN STEINMANN THREADED TIP (PIN) ×1 IMPLANT
PIN THREADED REVERSE (PIN) ×1 IMPLANT
PROTECTOR NERVE ULNAR (MISCELLANEOUS) ×2 IMPLANT
RESTRAINT HEAD UNIVERSAL NS (MISCELLANEOUS) ×2 IMPLANT
SCREW BONE CORT 6.5X35MM (Screw) ×1 IMPLANT
SCREW BONE LOCKING 4.75X30X3.5 (Screw) ×2 IMPLANT
SCREW LOCKING 4.75MMX15MM (Screw) ×2 IMPLANT
SLING ARM IMMOBILIZER LRG (SOFTGOODS) ×2 IMPLANT
STEM HUMERAL STRL 13MMX55MM (Stem) ×1 IMPLANT
STRIP CLOSURE SKIN 1/2X4 (GAUZE/BANDAGES/DRESSINGS) ×1 IMPLANT
SUCTION FRAZIER HANDLE 12FR (TUBING) ×2
SUCTION TUBE FRAZIER 12FR DISP (TUBING) ×1 IMPLANT
SUPPORT WRAP ARM LG (MISCELLANEOUS) ×2 IMPLANT
SUT MAXBRAID #2 CVD NDL (SUTURE) ×1 IMPLANT
SUT MAXBRAID #5 CCS-NDL 2PK (SUTURE) ×2 IMPLANT
SUT VIC AB 1 CT1 36 (SUTURE) ×2 IMPLANT
SUT VIC AB 2-0 CT1 27 (SUTURE) ×2
SUT VIC AB 2-0 CT1 TAPERPNT 27 (SUTURE) ×1 IMPLANT
SUT VIC AB 3-0 SH 8-18 (SUTURE) ×2 IMPLANT
TOWEL OR 17X26 10 PK STRL BLUE (TOWEL DISPOSABLE) ×2 IMPLANT
TOWEL OR NON WOVEN STRL DISP B (DISPOSABLE) ×2 IMPLANT
TOWER CARTRIDGE SMART MIX (DISPOSABLE) IMPLANT
TRAY HUM MINI SHOULDER +0 40D (Shoulder) ×1 IMPLANT
YANKAUER SUCT BULB TIP 10FT TU (MISCELLANEOUS) ×1 IMPLANT

## 2021-06-17 NOTE — Anesthesia Procedure Notes (Signed)
Anesthesia Regional Block: Interscalene brachial plexus block   Pre-Anesthetic Checklist: , timeout performed,  Correct Patient, Correct Site, Correct Laterality,  Correct Procedure, Correct Position, site marked,  Risks and benefits discussed,  Surgical consent,  Pre-op evaluation,  At surgeon's request and post-op pain management  Laterality: Left  Prep: Maximum Sterile Barrier Precautions used, chloraprep       Needles:  Injection technique: Single-shot  Needle Type: Echogenic Stimulator Needle     Needle Length: 9cm  Needle Gauge: 22     Additional Needles:   Procedures:,,,, ultrasound used (permanent image in chart),,    Narrative:  Start time: 06/17/2021 9:48 AM End time: 06/17/2021 9:52 AM Injection made incrementally with aspirations every 5 mL.  Performed by: Personally  Anesthesiologist: Freddrick March, MD  Additional Notes: Monitors applied. No increased pain on injection. No increased resistance to injection. Injection made in 5cc increments. Good needle visualization. Patient tolerated procedure well.

## 2021-06-17 NOTE — Progress Notes (Signed)
Assisted Dr. Hulan Fray with left shoulder ultrasound guided regional  block. Side rails up, monitors on throughout procedure. See vital signs in flow sheet. Tolerated Procedure well.

## 2021-06-17 NOTE — H&P (Signed)
SHOULDER ARTHROPLASTY ADMISSION H&P  Patient ID: Cory Walls MRN: [id number] DOB/AGE: Nov 10, 1945 76 y.o.  Chief Complaint: left shoulder pain.  Planned Procedure Date: 06/17/20 Medical Clearance by Dr. Leanna Battles   HPI: Cory Walls is a 76 y.o. male who presents for evaluation of djd left shoulder. The patient has a history of pain and functional disability in the left shoulder due to trauma and has failed non-surgical conservative treatments for greater than 12 weeks to include NSAID's and/or analgesics and activity modification.  Onset of symptoms was abrupt, starting  less than 6 months ago after a fall  with gradually worsening course since that time. The patient noted no past surgery on the left shoulder.  Patient currently rates pain at 3 out of 10 with activity. Patient has worsening of pain with activity and weight bearing and pain that interferes with activities of daily living.  Patient has evidence of a massive rotator cuff tear by imaging studies.  There is no active infection.  Past Medical History:  Diagnosis Date   Arthritis    hands    Colon polyps    Diabetes mellitus without complication ([place])    type II   History of blood transfusion    age 49   History of kidney stones    Hyperlipidemia    Nausea and vomiting - with colonoscopy preps 05/21/2015   Past Surgical History:  Procedure Laterality Date   APPENDECTOMY     CHOLECYSTECTOMY     COLONOSCOPY     HERNIA REPAIR     Hirschsprung's resection  1961   Allergies  Allergen Reactions   Moviprep [Peg-Kcl-Nacl-Nasulf-Na Asc-C] Nausea And Vomiting   Penicillins     REACTION: as child Has patient had a PCN reaction causing immediate rash, facial/tongue/throat swelling, SOB or lightheadedness with hypotension: n/a Has patient had a PCN reaction causing severe rash involving mucus membranes or skin necrosis: n/a Has patient had a PCN reaction that required hospitalization: n/a Has patient had a PCN  reaction occurring within the last 10 years: n/a   Prior to Admission medications   Medication Sig Start Date End Date Taking? Authorizing Provider  acetaminophen (TYLENOL) 500 MG tablet Take 1,000 mg by mouth every 6 (six) hours as needed for mild pain.   Yes [provider]  aspirin 81 MG tablet Take 81 mg by mouth daily.   Yes [provider]  cholecalciferol (VITAMIN D3) 25 MCG (1000 UNIT) tablet Take 2,000 Units by mouth daily.   Yes [provider]  fish oil-omega-3 fatty acids 1000 MG capsule Take 1 g by mouth 2 (two) times daily.   Yes [provider]  Glucosamine-Chondroit-Vit C-Mn (GLUCOSAMINE CHONDR 1500 COMPLX) CAPS Take 2 capsules by mouth daily.   Yes [provider]  ibuprofen (ADVIL) 200 MG tablet Take 400 mg by mouth every 6 (six) hours as needed for mild pain.   Yes [provider]  losartan (COZAAR) 25 MG tablet Take 25 mg by mouth every evening. 03/17/21  Yes [provider]  Magnesium 250 MG TABS Take 250 mg by mouth daily.   Yes [provider]  metFORMIN (GLUCOPHAGE-XR) 500 MG 24 hr tablet Take 500 mg by mouth every evening. 06/15/20  Yes [provider]  Multiple Vitamins-Minerals (OCUVITE ADULT 50+) CAPS Take 1 capsule by mouth daily.   Yes [provider]  MULTIPLE VITAMINS-MINERALS PO Take 1 tablet by mouth daily.   Yes [provider]  OVER THE COUNTER MEDICATION Apply 1  application topically daily as needed (arthritis pain). Australian Dream Cream   Yes [provider]  oxyCODONE-acetaminophen (PERCOCET) 7.5-325 MG tablet Take 1 tablet by mouth every 6 (six) hours as needed for pain. 04/26/21  Yes [provider]  pravastatin (PRAVACHOL) 40 MG tablet Take 40 mg by mouth every evening.   Yes [provider]  sildenafil (REVATIO) 20 MG tablet Take 20 mg by mouth daily as needed (ED). 02/04/18  Yes [provider]  tamsulosin (FLOMAX) 0.4 MG  CAPS capsule Take 0.4 mg by mouth every evening. 04/25/21  Yes [provider]  vitamin B-12 (CYANOCOBALAMIN) 1000 MCG tablet Take 1,000 mcg by mouth daily.   Yes [provider]  vitamin C (ASCORBIC ACID) 500 MG tablet Take 1,000 mg by mouth daily.   Yes [provider]  vitamin E 400 UNIT capsule Take 400 Units by mouth daily.   Yes [provider]   Social History   Socioeconomic History   Marital status: Married    Spouse name: Not on file   Number of children: 2   Years of education: Not on file   Highest education level: Not on file  Occupational History   Occupation: Retired  Tobacco Use   Smoking status: Every Day    Packs/day: 0.25    Types: Cigarettes, Cigars   Smokeless tobacco: Never   Tobacco comments:    smokes 2 to 3 cigarettes daily  Vaping Use   Vaping Use: Never used  Substance and Sexual Activity   Alcohol use: Yes    Alcohol/week: 1.0 standard drink    Types: 1 Glasses of wine per week    Comment: rare   Drug use: No   Sexual activity: Not on file  Other Topics Concern   Not on file  Social History Narrative   Not on file   Social Determinants of Health   Financial Resource Strain: Not on file  Food Insecurity: Not on file  Transportation Needs: Not on file  Physical Activity: Not on file  Stress: Not on file  Social Connections: Not on file   Family History  Problem Relation Age of Onset   Heart disease Father    Colon cancer Neg Hx    Stomach cancer Neg Hx    Colon polyps Neg Hx    Diabetes Neg Hx    Kidney disease Neg Hx    Rectal cancer Neg Hx     ROS: Currently denies lightheadedness, dizziness, Fever, chills, CP, SOB. No personal history of DVT, PE, MI, or CVA. No loose teeth or dentures All other systems have been reviewed and were otherwise currently negative with the exception of those mentioned in the HPI and as above.  Objective: Vitals: Ht: 70.15" Wt: 204.3 lbs Temp: 97.7 BP: 131/73  Pulse: 68 O2 98% on room air.   Physical Exam: General: Alert, NAD.   HEENT: EOMI, Good Neck Extension  Pulm: No increased work of breathing.  Clear B/L A/P w/o crackle or wheeze.  CV: RRR, No m/g/r appreciated  GI: soft, NT, ND Neuro: Neuro without gross focal deficit.  Sensation intact distally Skin: No lesions in the area of chief complaint MSK/Surgical Site: left shoulder pain with range of motion.  Forward flexion/abduction approximately 0-85 deg.  Internal rotation to L1 level.  0-40 deg external rotation No AC pain.  No Biceps pain. NVI distally.  Imaging Review MRI shows a massive rotator cuff tear with retraction  Assessment: Principal Problem:   Left  massive rotator cuff tear   Plan: Plan for Procedure(s): REVERSE SHOULDER ARTHROPLASTY  The patient history, physical exam, clinical judgement of the provider and imaging are consistent with a massive rotator cuff tear and reverse total joint arthroplasty is deemed medically necessary. The treatment options including medical management, injection therapy, and arthroplasty were discussed at length. The risks and benefits of Procedure(s): REVERSE SHOULDER ARTHROPLASTY were presented and reviewed.  The risks of nonoperative treatment, versus surgical intervention including but not limited to continued pain, aseptic loosening, stiffness, dislocation/subluxation, infection, bleeding, nerve injury, blood clots, cardiopulmonary complications, morbidity, mortality, among others were discussed. The patient verbalizes understanding and wishes to proceed with the plan.  Patient is being admitted for surgery, OT, pain control, prophylactic antibiotics, VTE prophylaxis, progressive ambulation, ADL's and discharge planning.   Dental prophylaxis discussed and recommended for 2 years postoperatively.  The patient does  meet the criteria for TXA which will be used perioperatively.   The patient is planning to be discharged home care of his  wife    Jola Baptist 06/17/2021 6:28 AM

## 2021-06-17 NOTE — Discharge Instructions (Signed)
Diet: As you were doing prior to hospitalization   Shower:  May shower but keep the wounds dry, use an occlusive plastic wrap, NO SOAKING IN TUB.  If the bandage gets wet, change with a clean dry gauze.  Dressing: Keep dressing in place until follow up visit. There are sticky tapes (steri-strips) on your wounds and all the stitches are absorbable.  Leave the steri-strips in place when changing your dressings, they will peel off with time, usually 2-3 weeks.  Activity:  Increase activity slowly as tolerated, but follow the weight bearing instructions below.  The rules on driving is that you can not be taking narcotics while you drive, and you must feel in control of the vehicle.    Weight Bearing:  No weight bearing with left arm.   To prevent constipation: you may use a stool softener such as -  Colace (over the counter) 100 mg by mouth twice a day  Drink plenty of fluids (prune juice may be helpful) and high fiber foods Miralax (over the counter) for constipation as needed.    Itching:  If you experience itching with your medications, try taking only a single pain pill, or even half a pain pill at a time.  You may take up to 10 pain pills per day, and you can also use benadryl over the counter for itching or also to help with sleep.   Precautions:  If you experience chest pain or shortness of breath - call 911 immediately for transfer to the hospital emergency department!!  If you develop a fever greater that 101 F, purulent drainage from wound, increased redness or drainage from wound, or calf pain -- Call the office at [phone]                                                Follow- Up Appointment:  Please call for an appointment to be seen in 2 weeks [place] - [phone]

## 2021-06-17 NOTE — Anesthesia Procedure Notes (Signed)
Procedure Name: Intubation Date/Time: 06/17/2021 10:48 AM Performed by: Lieutenant Diego, CRNA Pre-anesthesia Checklist: Patient identified, Emergency Drugs available, Suction available and Patient being monitored Patient Re-evaluated:Patient Re-evaluated prior to induction Oxygen Delivery Method: Circle system utilized Preoxygenation: Pre-oxygenation with 100% oxygen Induction Type: IV induction Ventilation: Mask ventilation without difficulty Laryngoscope Size: Miller, 2 and Glidescope Grade View: Grade II Tube type: Oral Tube size: 7.5 mm Number of attempts: 2 Airway Equipment and Method: Stylet and Video-laryngoscopy Placement Confirmation: ETT inserted through vocal cords under direct vision, positive ETCO2 and breath sounds checked- equal and bilateral Secured at: 25 cm Tube secured with: Tape Dental Injury: Teeth and Oropharynx as per pre-operative assessment

## 2021-06-17 NOTE — Interval H&P Note (Signed)
History and Physical Interval Note:  06/17/2021 9:51 AM  Cory Walls  has presented today for surgery, with the diagnosis of djd left shoulder.  The various methods of treatment have been discussed with the patient and family. After consideration of risks, benefits and other options for treatment, the patient has consented to  Procedure(s): REVERSE SHOULDER ARTHROPLASTY (Left) as a surgical intervention.  The patient's history has been reviewed, patient examined, no change in status, stable for surgery.  I have reviewed the patient's chart and labs.  Questions were answered to the patient's satisfaction.     Johnny Bridge

## 2021-06-17 NOTE — Evaluation (Addendum)
Occupational Therapy Evaluation Patient Details Name: Cory Walls MRN: [id number] DOB: [birth year] Today's Date: 06/17/2021   History of Present Illness Patient is a 76 year old male who underwent total shoulder replacement of L shoulder. PMH: arthritis   Clinical Impression   s/p shoulder replacement without functional use of left non dominant upper extremity secondary to effects of surgery and interscalene block and shoulder precautions. Therapist provided education and instruction to patient and spouse in regards to exercises, precautions, positioning, donning upper extremity clothing and bathing while maintaining shoulder precautions, ice and edema management and donning/doffing sling. Patient and spouse verbalized understanding and demonstrated as needed. Patient needed assistance to donn shirt, underwear, pants, socks and shoes and provided with instruction on compensatory strategies to perform ADLs. Patient to follow up with MD for further therapy needs.        Recommendations for follow up therapy are one component of a multi-disciplinary discharge planning process, led by the attending physician.  Recommendations may be updated based on patient status, additional functional criteria and insurance authorization.   Follow Up Recommendations  Follow physician's recommendations for discharge plan and follow up therapies    Assistance Recommended at Discharge Frequent or constant Supervision/Assistance  Patient can return home with the following A little help with walking and/or transfers;A little help with bathing/dressing/bathroom    Functional Status Assessment  Patient has had a recent decline in their functional status and demonstrates the ability to make significant improvements in function in a reasonable and predictable amount of time.  Equipment Recommendations  None recommended by OT    Recommendations for Other Services       Precautions / Restrictions  Precautions Precautions: Shoulder Type of Shoulder Precautions: No ROM shoulder, ok AROM of elbow, wrist and hand Shoulder Interventions: Shoulder sling/immobilizer;Off for dressing/bathing/exercises;At all times Precaution Booklet Issued: Yes (comment) (handout) Required Braces or Orthoses: Sling Restrictions Weight Bearing Restrictions: Yes LUE Weight Bearing: Non weight bearing      Mobility Bed Mobility                    Transfers                          Balance                                           ADL either performed or assessed with clinical judgement   ADL                                         General ADL Comments: patient was mod A for Ub dressing and min A for LB dressing with gripper socks making it difficult to don pants. patient and wife were educated on compensatory strategies to maintain shoulder precautions for all ADL tasks. patient and wife verbalized/demonstrated understanding. patient completed toileting tasks with MI with sling in place on this date.     Vision Patient Visual Report: No change from baseline       Perception     Praxis      Pertinent Vitals/Pain Pain Assessment: No/denies pain (nerve block in place)     Hand Dominance     Extremity/Trunk Assessment Upper Extremity Assessment Upper Extremity Assessment: LUE deficits/detail LUE Deficits /  Details: shoulder surgery with nerve block in place           Communication     Cognition Arousal/Alertness: Awake/alert Behavior During Therapy: [facility] for tasks assessed/performed Overall Cognitive Status: Within Functional Limits for tasks assessed                                 General Comments: wife present as well     General Comments       Exercises     Shoulder Instructions Shoulder Instructions Donning/doffing shirt without moving shoulder: Minimal assistance;Caregiver independent with  task;Set-up Method for sponge bathing under operated UE: Caregiver independent with task;Patient able to independently direct caregiver Donning/doffing sling/immobilizer: Caregiver independent with task;Patient able to independently direct caregiver Correct positioning of sling/immobilizer: Caregiver independent with task;Patient able to independently direct caregiver ROM for elbow, wrist and digits of operated UE: Caregiver independent with task;Patient able to independently direct caregiver Sling wearing schedule (on at all times/off for ADL's): Caregiver independent with task;Patient able to independently direct caregiver Proper positioning of operated UE when showering: Caregiver independent with task;Patient able to independently direct caregiver Positioning of UE while sleeping: Caregiver independent with task;Patient able to independently direct caregiver    Home Living Family/patient expects to be discharged to:: Private residence Living Arrangements: Spouse/significant other Available Help at Discharge: Family;Available PRN/intermittently                                    Prior Functioning/Environment                          OT Problem List: Pain;Decreased safety awareness;Impaired UE functional use;Decreased knowledge of precautions;Decreased knowledge of use of DME or AE      OT Treatment/Interventions:      OT Goals(Current goals can be found in the care plan section) Acute Rehab OT Goals OT Goal Formulation: All assessment and education complete, DC therapy  OT Frequency:      Co-evaluation              AM-PAC OT "6 Clicks" Daily Activity     Outcome Measure Help from another person eating meals?: None Help from another person taking care of personal grooming?: None Help from another person toileting, which includes using toliet, bedpan, or urinal?: A Little Help from another person bathing (including washing, rinsing, drying)?: A  Little Help from another person to put on and taking off regular upper body clothing?: A Little Help from another person to put on and taking off regular lower body clothing?: A Little 6 Click Score: 20   End of Session Equipment Utilized During Treatment: Other (comment) (sling) Nurse Communication: Mobility status  Activity Tolerance: Patient tolerated treatment well Patient left: in chair;with call bell/phone within reach;with family/visitor present  OT Visit Diagnosis: Pain Pain - Right/Left: Left Pain - part of body: Shoulder                Time: 1455-1531 OT Time Calculation (min): 36 min Charges:  OT General Charges $OT Visit: 1 Visit OT Evaluation $OT Eval Low Complexity: 1 Low OT Treatments $Self Care/Home Management : 8-22 mins  Jackelyn Poling OTR/L, MS Acute Rehabilitation Department Office# [phone] Pager# [phone]   Marcellina Millin 06/17/2021, 4:28 PM

## 2021-06-17 NOTE — Transfer of Care (Signed)
Immediate Anesthesia Transfer of Care Note  Patient: Cory Walls  Procedure(s) Performed: REVERSE SHOULDER ARTHROPLASTY (Left: Shoulder)  Patient Location: PACU  Anesthesia Type:General and Regional  Level of Consciousness: awake and alert   Airway & Oxygen Therapy: Patient Spontanous Breathing and Patient connected to face mask oxygen  Post-op Assessment: Report given to RN and Post -op Vital signs reviewed and stable  Post vital signs: Reviewed and stable  Last Vitals:  Vitals Value Taken Time  BP    Temp    Pulse    Resp    SpO2      Last Pain:  Vitals:   06/17/21 1000  TempSrc:   PainSc: 0-No pain      Patients Stated Pain Goal: 3 (01/41/03 0131)  Complications: No notable events documented.

## 2021-06-17 NOTE — Op Note (Signed)
06/17/2021  12:54 PM  PATIENT:  Cory Walls    PRE-OPERATIVE DIAGNOSIS: Left shoulder irreparable rotator cuff tear  POST-OPERATIVE DIAGNOSIS:  Same  PROCEDURE: LEFT reverse Total Shoulder Arthroplasty  SURGEON:  Johnny Bridge, MD  PHYSICIAN ASSISTANT: Merlene Pulling, PA-C, present and scrubbed throughout the case, critical for completion in a timely fashion, and for retraction, instrumentation, and closure.  ANESTHESIA:   General with interscalene block using Exparel  ESTIMATED BLOOD LOSS: 150 mL  UNIQUE ASPECTS OF THE CASE: The glenohumeral articular cartilage was all still intact, but there was an irreparable supraspinatus tear.  The subscapularis was present, and able to be repaired.  At the end of the procedure, the reduction was a little bit challenging, but did give a satisfactory reduction and felt stable.  I was able to get the subscapularis repaired back to bone.  The inferior most screw on the baseplate had a moderate amount of bleeding, I think this was simply cancellous from the bone.  I was slightly anteverted with the glenosphere, I took down a little bit more anterior bone and posterior bone.  PREOPERATIVE INDICATIONS:  Cory Walls is a  76 y.o. male with a diagnosis of massive left irreparable rotator cuff tear, left shoulder who failed conservative measures and elected for surgical management.    The risks benefits and alternatives were discussed with the patient preoperatively including but not limited to the risks of infection, bleeding, nerve injury, cardiopulmonary complications, the need for revision surgery, dislocation, brachial plexus palsy, incomplete relief of pain, among others, and the patient was willing to proceed.  OPERATIVE IMPLANTS: Biomet size 13 micro humeral stem press-fit with a 40 + 0 mm offset reverse shoulder arthroplasty tray with a 36 mm Vivacit-E standardy polyethylene liner and a 36 mm glenosphere set on B placed with inferior offset, with  a mini baseplate and 4 locking screws and one central nonlocking screw.  OPERATIVE FINDINGS: Advanced supraspinatus tear with retraction and atrophy.  OPERATIVE PROCEDURE: The patient was brought to the operating room and placed in the supine position. General anesthesia was administered. IV antibiotics were given.  Time out was performed. The upper extremity was prepped and draped in usual sterile fashion. The patient was in a beachchair position. Deltopectoral approach was carried out. The biceps was tenodesed to the pectoralis tendon with #2 max braid. The subscapularis was released off of the bone.   I then performed circumferential releases of the humerus, and then dislocated the head, and then reamed with the reamer to the above named size.  I then applied the jig, and cut the humeral head in 30 of retroversion, and then turned my attention to the glenoid.  Deep retractors were placed, and I resected the labrum, and then placed a guidepin into the center position on the glenoid, with slight inferior inclination. I then reamed over the guidepin, and this created a small metaphyseal cancellus blush inferiorly, removing just the cartilage to the subchondral bone superiorly. The base plate was selected and impacted place, and then I secured it centrally with a nonlocking screw, and I had excellent purchase both inferiorly and superiorly. I placed a short locking screws on anterior and posterior aspects.  I then turned my attention to the glenosphere, and impacted this into place, placing slight inferior offset (set on B).   The glenosphere was completely seated, and had engagement of the [facility] taper. I then turned my attention back to the humerus.  I sequentially broached, and then trialed, and was  found to restore soft tissue tension, and I perch the trial, but did not fully reduce it, feeling that I had adequate tension. Therefore the above named components were selected. The shoulder felt  stable throughout functional motion.  Before I placed the real prosthesis I had also placed a total of 3 #5 max braid through drill holes in the humerus for later subscapularis repair.  I then impacted the real prosthesis into place, as well as the real humeral tray, and reduced the shoulder. The shoulder had excellent motion, and was stable, and I irrigated the wounds copiously.    I then used these to repair the subscapularis. This came down to bone.  I then irrigated the shoulder copiously once more, repaired the deltopectoral interval with Vicryl followed by subcutaneous Vicryl with Steri-Strips and sterile gauze for the skin. The patient was awakened and returned back in stable and satisfactory condition. There were no complications and He tolerated the procedure well.

## 2021-06-18 DIAGNOSIS — M25512 Pain in left shoulder: Secondary | ICD-10-CM | POA: Diagnosis not present

## 2021-06-18 NOTE — Anesthesia Postprocedure Evaluation (Signed)
Anesthesia Post Note  Patient: Steward Drone  Procedure(s) Performed: REVERSE SHOULDER ARTHROPLASTY (Left: Shoulder)     Patient location during evaluation: PACU Anesthesia Type: Regional and General Level of consciousness: awake and alert Pain management: pain level controlled Vital Signs Assessment: post-procedure vital signs reviewed and stable Respiratory status: spontaneous breathing, nonlabored ventilation, respiratory function stable and patient connected to nasal cannula oxygen Cardiovascular status: blood pressure returned to baseline and stable Postop Assessment: no apparent nausea or vomiting Anesthetic complications: no   No notable events documented.  Last Vitals:  Vitals:   06/17/21 1400 06/17/21 1500  BP: 110/62   Pulse: 79   Resp: 12   Temp:    SpO2: 93% 99%    Last Pain:  Vitals:   06/17/21 1500  TempSrc:   PainSc: 0-No pain                 Shannah Conteh L Shila Kruczek

## 2021-06-19 ENCOUNTER — Encounter (HOSPITAL_COMMUNITY): Payer: Self-pay | Admitting: Orthopedic Surgery

## 2021-07-02 DIAGNOSIS — M19012 Primary osteoarthritis, left shoulder: Secondary | ICD-10-CM | POA: Diagnosis not present

## 2021-07-15 DIAGNOSIS — R1084 Generalized abdominal pain: Secondary | ICD-10-CM | POA: Diagnosis not present

## 2021-07-15 DIAGNOSIS — N202 Calculus of kidney with calculus of ureter: Secondary | ICD-10-CM | POA: Diagnosis not present

## 2021-08-01 DIAGNOSIS — M25612 Stiffness of left shoulder, not elsewhere classified: Secondary | ICD-10-CM | POA: Diagnosis not present

## 2021-08-01 DIAGNOSIS — M6281 Muscle weakness (generalized): Secondary | ICD-10-CM | POA: Diagnosis not present

## 2021-08-01 DIAGNOSIS — Z96612 Presence of left artificial shoulder joint: Secondary | ICD-10-CM | POA: Diagnosis not present

## 2021-08-05 DIAGNOSIS — H353121 Nonexudative age-related macular degeneration, left eye, early dry stage: Secondary | ICD-10-CM | POA: Diagnosis not present

## 2021-08-05 DIAGNOSIS — M6281 Muscle weakness (generalized): Secondary | ICD-10-CM | POA: Diagnosis not present

## 2021-08-05 DIAGNOSIS — M25612 Stiffness of left shoulder, not elsewhere classified: Secondary | ICD-10-CM | POA: Diagnosis not present

## 2021-08-05 DIAGNOSIS — H353212 Exudative age-related macular degeneration, right eye, with inactive choroidal neovascularization: Secondary | ICD-10-CM | POA: Diagnosis not present

## 2021-08-05 DIAGNOSIS — Z96612 Presence of left artificial shoulder joint: Secondary | ICD-10-CM | POA: Diagnosis not present

## 2021-08-05 DIAGNOSIS — H25813 Combined forms of age-related cataract, bilateral: Secondary | ICD-10-CM | POA: Diagnosis not present

## 2021-08-05 DIAGNOSIS — B309 Viral conjunctivitis, unspecified: Secondary | ICD-10-CM | POA: Diagnosis not present

## 2021-08-07 DIAGNOSIS — M25612 Stiffness of left shoulder, not elsewhere classified: Secondary | ICD-10-CM | POA: Diagnosis not present

## 2021-08-07 DIAGNOSIS — M6281 Muscle weakness (generalized): Secondary | ICD-10-CM | POA: Diagnosis not present

## 2021-08-07 DIAGNOSIS — Z96612 Presence of left artificial shoulder joint: Secondary | ICD-10-CM | POA: Diagnosis not present

## 2021-08-13 DIAGNOSIS — M6281 Muscle weakness (generalized): Secondary | ICD-10-CM | POA: Diagnosis not present

## 2021-08-13 DIAGNOSIS — M25612 Stiffness of left shoulder, not elsewhere classified: Secondary | ICD-10-CM | POA: Diagnosis not present

## 2021-08-13 DIAGNOSIS — Z96612 Presence of left artificial shoulder joint: Secondary | ICD-10-CM | POA: Diagnosis not present

## 2021-08-15 DIAGNOSIS — Z96612 Presence of left artificial shoulder joint: Secondary | ICD-10-CM | POA: Diagnosis not present

## 2021-08-15 DIAGNOSIS — M25612 Stiffness of left shoulder, not elsewhere classified: Secondary | ICD-10-CM | POA: Diagnosis not present

## 2021-08-15 DIAGNOSIS — M6281 Muscle weakness (generalized): Secondary | ICD-10-CM | POA: Diagnosis not present

## 2021-08-20 DIAGNOSIS — Z96612 Presence of left artificial shoulder joint: Secondary | ICD-10-CM | POA: Diagnosis not present

## 2021-08-20 DIAGNOSIS — M6281 Muscle weakness (generalized): Secondary | ICD-10-CM | POA: Diagnosis not present

## 2021-08-20 DIAGNOSIS — M25612 Stiffness of left shoulder, not elsewhere classified: Secondary | ICD-10-CM | POA: Diagnosis not present

## 2021-08-22 DIAGNOSIS — M25612 Stiffness of left shoulder, not elsewhere classified: Secondary | ICD-10-CM | POA: Diagnosis not present

## 2021-08-22 DIAGNOSIS — M6281 Muscle weakness (generalized): Secondary | ICD-10-CM | POA: Diagnosis not present

## 2021-08-22 DIAGNOSIS — Z96612 Presence of left artificial shoulder joint: Secondary | ICD-10-CM | POA: Diagnosis not present

## 2021-08-26 DIAGNOSIS — M25612 Stiffness of left shoulder, not elsewhere classified: Secondary | ICD-10-CM | POA: Diagnosis not present

## 2021-08-26 DIAGNOSIS — M6281 Muscle weakness (generalized): Secondary | ICD-10-CM | POA: Diagnosis not present

## 2021-08-26 DIAGNOSIS — M180 Bilateral primary osteoarthritis of first carpometacarpal joints: Secondary | ICD-10-CM | POA: Diagnosis not present

## 2021-08-26 DIAGNOSIS — Z96612 Presence of left artificial shoulder joint: Secondary | ICD-10-CM | POA: Diagnosis not present

## 2021-08-26 DIAGNOSIS — R634 Abnormal weight loss: Secondary | ICD-10-CM | POA: Diagnosis not present

## 2021-08-26 DIAGNOSIS — F17210 Nicotine dependence, cigarettes, uncomplicated: Secondary | ICD-10-CM | POA: Diagnosis not present

## 2021-08-26 DIAGNOSIS — E119 Type 2 diabetes mellitus without complications: Secondary | ICD-10-CM | POA: Diagnosis not present

## 2021-08-26 DIAGNOSIS — R030 Elevated blood-pressure reading, without diagnosis of hypertension: Secondary | ICD-10-CM | POA: Diagnosis not present

## 2021-08-26 DIAGNOSIS — E782 Mixed hyperlipidemia: Secondary | ICD-10-CM | POA: Diagnosis not present

## 2021-08-28 ENCOUNTER — Other Ambulatory Visit: Payer: Self-pay | Admitting: Internal Medicine

## 2021-08-28 DIAGNOSIS — M25612 Stiffness of left shoulder, not elsewhere classified: Secondary | ICD-10-CM | POA: Diagnosis not present

## 2021-08-28 DIAGNOSIS — M6281 Muscle weakness (generalized): Secondary | ICD-10-CM | POA: Diagnosis not present

## 2021-08-28 DIAGNOSIS — Z96612 Presence of left artificial shoulder joint: Secondary | ICD-10-CM | POA: Diagnosis not present

## 2021-09-02 DIAGNOSIS — Z96612 Presence of left artificial shoulder joint: Secondary | ICD-10-CM | POA: Diagnosis not present

## 2021-09-02 DIAGNOSIS — M6281 Muscle weakness (generalized): Secondary | ICD-10-CM | POA: Diagnosis not present

## 2021-09-02 DIAGNOSIS — M25612 Stiffness of left shoulder, not elsewhere classified: Secondary | ICD-10-CM | POA: Diagnosis not present

## 2021-09-04 DIAGNOSIS — M6281 Muscle weakness (generalized): Secondary | ICD-10-CM | POA: Diagnosis not present

## 2021-09-04 DIAGNOSIS — Z96612 Presence of left artificial shoulder joint: Secondary | ICD-10-CM | POA: Diagnosis not present

## 2021-09-04 DIAGNOSIS — M25612 Stiffness of left shoulder, not elsewhere classified: Secondary | ICD-10-CM | POA: Diagnosis not present

## 2021-09-08 DIAGNOSIS — M6281 Muscle weakness (generalized): Secondary | ICD-10-CM | POA: Diagnosis not present

## 2021-09-08 DIAGNOSIS — M25612 Stiffness of left shoulder, not elsewhere classified: Secondary | ICD-10-CM | POA: Diagnosis not present

## 2021-09-08 DIAGNOSIS — Z96612 Presence of left artificial shoulder joint: Secondary | ICD-10-CM | POA: Diagnosis not present

## 2021-09-10 DIAGNOSIS — M6281 Muscle weakness (generalized): Secondary | ICD-10-CM | POA: Diagnosis not present

## 2021-09-10 DIAGNOSIS — Z96612 Presence of left artificial shoulder joint: Secondary | ICD-10-CM | POA: Diagnosis not present

## 2021-09-10 DIAGNOSIS — M25612 Stiffness of left shoulder, not elsewhere classified: Secondary | ICD-10-CM | POA: Diagnosis not present

## 2021-09-16 DIAGNOSIS — M25612 Stiffness of left shoulder, not elsewhere classified: Secondary | ICD-10-CM | POA: Diagnosis not present

## 2021-09-16 DIAGNOSIS — Z96612 Presence of left artificial shoulder joint: Secondary | ICD-10-CM | POA: Diagnosis not present

## 2021-09-16 DIAGNOSIS — M6281 Muscle weakness (generalized): Secondary | ICD-10-CM | POA: Diagnosis not present

## 2021-09-18 DIAGNOSIS — M25612 Stiffness of left shoulder, not elsewhere classified: Secondary | ICD-10-CM | POA: Diagnosis not present

## 2021-09-18 DIAGNOSIS — M6281 Muscle weakness (generalized): Secondary | ICD-10-CM | POA: Diagnosis not present

## 2021-09-18 DIAGNOSIS — Z96612 Presence of left artificial shoulder joint: Secondary | ICD-10-CM | POA: Diagnosis not present

## 2021-09-23 DIAGNOSIS — M6281 Muscle weakness (generalized): Secondary | ICD-10-CM | POA: Diagnosis not present

## 2021-09-23 DIAGNOSIS — Z96612 Presence of left artificial shoulder joint: Secondary | ICD-10-CM | POA: Diagnosis not present

## 2021-09-23 DIAGNOSIS — M25612 Stiffness of left shoulder, not elsewhere classified: Secondary | ICD-10-CM | POA: Diagnosis not present

## 2021-09-26 ENCOUNTER — Other Ambulatory Visit (HOSPITAL_BASED_OUTPATIENT_CLINIC_OR_DEPARTMENT_OTHER): Payer: Self-pay | Admitting: Internal Medicine

## 2021-09-26 DIAGNOSIS — F17210 Nicotine dependence, cigarettes, uncomplicated: Secondary | ICD-10-CM

## 2021-09-29 DIAGNOSIS — M6281 Muscle weakness (generalized): Secondary | ICD-10-CM | POA: Diagnosis not present

## 2021-09-29 DIAGNOSIS — Z96612 Presence of left artificial shoulder joint: Secondary | ICD-10-CM | POA: Diagnosis not present

## 2021-09-29 DIAGNOSIS — M25612 Stiffness of left shoulder, not elsewhere classified: Secondary | ICD-10-CM | POA: Diagnosis not present

## 2021-10-02 ENCOUNTER — Ambulatory Visit (HOSPITAL_BASED_OUTPATIENT_CLINIC_OR_DEPARTMENT_OTHER)
Admission: RE | Admit: 2021-10-02 | Discharge: 2021-10-02 | Disposition: A | Discharge status: Home / Self Care | Payer: Medicare Other | Source: Ambulatory Visit | Attending: Internal Medicine | Admitting: Internal Medicine

## 2021-10-02 DIAGNOSIS — F17210 Nicotine dependence, cigarettes, uncomplicated: Secondary | ICD-10-CM

## 2021-10-02 DIAGNOSIS — E041 Nontoxic single thyroid nodule: Secondary | ICD-10-CM | POA: Diagnosis not present

## 2021-10-02 DIAGNOSIS — I700 Atherosclerosis of aorta: Secondary | ICD-10-CM | POA: Insufficient documentation

## 2021-10-02 DIAGNOSIS — I7121 Aneurysm of the ascending aorta, without rupture: Secondary | ICD-10-CM | POA: Diagnosis not present

## 2021-10-07 DIAGNOSIS — I7121 Aneurysm of the ascending aorta, without rupture: Secondary | ICD-10-CM | POA: Diagnosis not present

## 2021-10-07 DIAGNOSIS — I700 Atherosclerosis of aorta: Secondary | ICD-10-CM | POA: Diagnosis not present

## 2021-10-07 DIAGNOSIS — E041 Nontoxic single thyroid nodule: Secondary | ICD-10-CM | POA: Diagnosis not present

## 2021-10-07 DIAGNOSIS — I2510 Atherosclerotic heart disease of native coronary artery without angina pectoris: Secondary | ICD-10-CM | POA: Diagnosis not present

## 2021-10-08 DIAGNOSIS — M25512 Pain in left shoulder: Secondary | ICD-10-CM | POA: Diagnosis not present

## 2021-10-09 ENCOUNTER — Other Ambulatory Visit (HOSPITAL_BASED_OUTPATIENT_CLINIC_OR_DEPARTMENT_OTHER): Payer: Self-pay | Admitting: Internal Medicine

## 2021-10-09 DIAGNOSIS — E041 Nontoxic single thyroid nodule: Secondary | ICD-10-CM

## 2021-10-13 ENCOUNTER — Other Ambulatory Visit: Payer: Medicare Other

## 2021-10-13 ENCOUNTER — Ambulatory Visit (HOSPITAL_BASED_OUTPATIENT_CLINIC_OR_DEPARTMENT_OTHER)
Admission: RE | Admit: 2021-10-13 | Discharge: 2021-10-13 | Disposition: A | Discharge status: Home / Self Care | Payer: Medicare Other | Source: Ambulatory Visit | Attending: Internal Medicine | Admitting: Internal Medicine

## 2021-10-13 DIAGNOSIS — E041 Nontoxic single thyroid nodule: Secondary | ICD-10-CM | POA: Insufficient documentation

## 2021-10-16 ENCOUNTER — Other Ambulatory Visit: Payer: Self-pay | Admitting: Internal Medicine

## 2021-10-16 DIAGNOSIS — E041 Nontoxic single thyroid nodule: Secondary | ICD-10-CM

## 2021-10-29 ENCOUNTER — Other Ambulatory Visit (HOSPITAL_COMMUNITY)
Admission: RE | Admit: 2021-10-29 | Discharge: 2021-10-29 | Disposition: A | Discharge status: Home / Self Care | Payer: Medicare Other | Source: Ambulatory Visit | Attending: Internal Medicine | Admitting: Internal Medicine

## 2021-10-29 ENCOUNTER — Ambulatory Visit
Admission: RE | Admit: 2021-10-29 | Discharge: 2021-10-29 | Disposition: A | Discharge status: Home / Self Care | Payer: Medicare Other | Source: Ambulatory Visit | Attending: Internal Medicine | Admitting: Internal Medicine

## 2021-10-29 DIAGNOSIS — E041 Nontoxic single thyroid nodule: Secondary | ICD-10-CM | POA: Diagnosis not present

## 2021-10-29 DIAGNOSIS — D440 Neoplasm of uncertain behavior of thyroid gland: Secondary | ICD-10-CM | POA: Diagnosis not present

## 2021-10-29 DIAGNOSIS — E0789 Other specified disorders of thyroid: Secondary | ICD-10-CM | POA: Diagnosis not present

## 2021-10-30 LAB — CYTOLOGY - NON PAP

## 2021-11-03 ENCOUNTER — Other Ambulatory Visit (HOSPITAL_COMMUNITY): Payer: Self-pay | Admitting: Orthopedic Surgery

## 2021-11-03 DIAGNOSIS — Z96612 Presence of left artificial shoulder joint: Secondary | ICD-10-CM

## 2021-11-18 ENCOUNTER — Encounter (HOSPITAL_COMMUNITY): Payer: Self-pay

## 2022-01-09 DIAGNOSIS — M25562 Pain in left knee: Secondary | ICD-10-CM | POA: Diagnosis not present

## 2022-01-12 DIAGNOSIS — M25562 Pain in left knee: Secondary | ICD-10-CM | POA: Diagnosis not present

## 2022-01-15 DIAGNOSIS — H25813 Combined forms of age-related cataract, bilateral: Secondary | ICD-10-CM | POA: Diagnosis not present

## 2022-01-15 DIAGNOSIS — E119 Type 2 diabetes mellitus without complications: Secondary | ICD-10-CM | POA: Diagnosis not present

## 2022-01-15 DIAGNOSIS — H353121 Nonexudative age-related macular degeneration, left eye, early dry stage: Secondary | ICD-10-CM | POA: Diagnosis not present

## 2022-01-15 DIAGNOSIS — H353212 Exudative age-related macular degeneration, right eye, with inactive choroidal neovascularization: Secondary | ICD-10-CM | POA: Diagnosis not present

## 2022-01-15 DIAGNOSIS — H43812 Vitreous degeneration, left eye: Secondary | ICD-10-CM | POA: Diagnosis not present

## 2022-03-12 DIAGNOSIS — Z23 Encounter for immunization: Secondary | ICD-10-CM | POA: Diagnosis not present

## 2022-03-31 DIAGNOSIS — E119 Type 2 diabetes mellitus without complications: Secondary | ICD-10-CM | POA: Diagnosis not present

## 2022-03-31 DIAGNOSIS — E782 Mixed hyperlipidemia: Secondary | ICD-10-CM | POA: Diagnosis not present

## 2022-03-31 DIAGNOSIS — Z1212 Encounter for screening for malignant neoplasm of rectum: Secondary | ICD-10-CM | POA: Diagnosis not present

## 2022-03-31 DIAGNOSIS — R7989 Other specified abnormal findings of blood chemistry: Secondary | ICD-10-CM | POA: Diagnosis not present

## 2022-03-31 DIAGNOSIS — E041 Nontoxic single thyroid nodule: Secondary | ICD-10-CM | POA: Diagnosis not present

## 2022-03-31 DIAGNOSIS — R82998 Other abnormal findings in urine: Secondary | ICD-10-CM | POA: Diagnosis not present

## 2022-03-31 DIAGNOSIS — Z125 Encounter for screening for malignant neoplasm of prostate: Secondary | ICD-10-CM | POA: Diagnosis not present

## 2022-04-01 DIAGNOSIS — M180 Bilateral primary osteoarthritis of first carpometacarpal joints: Secondary | ICD-10-CM | POA: Diagnosis not present

## 2022-04-01 DIAGNOSIS — S83242A Other tear of medial meniscus, current injury, left knee, initial encounter: Secondary | ICD-10-CM | POA: Diagnosis not present

## 2022-04-07 DIAGNOSIS — F17210 Nicotine dependence, cigarettes, uncomplicated: Secondary | ICD-10-CM | POA: Diagnosis not present

## 2022-04-07 DIAGNOSIS — M180 Bilateral primary osteoarthritis of first carpometacarpal joints: Secondary | ICD-10-CM | POA: Diagnosis not present

## 2022-04-07 DIAGNOSIS — I2510 Atherosclerotic heart disease of native coronary artery without angina pectoris: Secondary | ICD-10-CM | POA: Diagnosis not present

## 2022-04-07 DIAGNOSIS — E119 Type 2 diabetes mellitus without complications: Secondary | ICD-10-CM | POA: Diagnosis not present

## 2022-04-07 DIAGNOSIS — Z1331 Encounter for screening for depression: Secondary | ICD-10-CM | POA: Diagnosis not present

## 2022-04-07 DIAGNOSIS — I700 Atherosclerosis of aorta: Secondary | ICD-10-CM | POA: Diagnosis not present

## 2022-04-07 DIAGNOSIS — Z1389 Encounter for screening for other disorder: Secondary | ICD-10-CM | POA: Diagnosis not present

## 2022-04-07 DIAGNOSIS — E782 Mixed hyperlipidemia: Secondary | ICD-10-CM | POA: Diagnosis not present

## 2022-04-07 DIAGNOSIS — Z0000 Encounter for general adult medical examination without abnormal findings: Secondary | ICD-10-CM | POA: Diagnosis not present

## 2022-05-18 DIAGNOSIS — L578 Other skin changes due to chronic exposure to nonionizing radiation: Secondary | ICD-10-CM | POA: Diagnosis not present

## 2022-05-18 DIAGNOSIS — D485 Neoplasm of uncertain behavior of skin: Secondary | ICD-10-CM | POA: Diagnosis not present

## 2022-05-18 DIAGNOSIS — D225 Melanocytic nevi of trunk: Secondary | ICD-10-CM | POA: Diagnosis not present

## 2022-05-18 DIAGNOSIS — Z86018 Personal history of other benign neoplasm: Secondary | ICD-10-CM | POA: Diagnosis not present

## 2022-05-18 DIAGNOSIS — L821 Other seborrheic keratosis: Secondary | ICD-10-CM | POA: Diagnosis not present

## 2022-05-18 DIAGNOSIS — L814 Other melanin hyperpigmentation: Secondary | ICD-10-CM | POA: Diagnosis not present

## 2022-05-28 DIAGNOSIS — D239 Other benign neoplasm of skin, unspecified: Secondary | ICD-10-CM | POA: Diagnosis not present

## 2022-05-28 DIAGNOSIS — D485 Neoplasm of uncertain behavior of skin: Secondary | ICD-10-CM | POA: Diagnosis not present

## 2022-05-28 DIAGNOSIS — D235 Other benign neoplasm of skin of trunk: Secondary | ICD-10-CM | POA: Diagnosis not present

## 2022-07-02 DIAGNOSIS — Z5189 Encounter for other specified aftercare: Secondary | ICD-10-CM | POA: Diagnosis not present

## 2022-07-02 DIAGNOSIS — Z86018 Personal history of other benign neoplasm: Secondary | ICD-10-CM | POA: Diagnosis not present

## 2022-07-16 DIAGNOSIS — N200 Calculus of kidney: Secondary | ICD-10-CM | POA: Diagnosis not present

## 2022-07-20 ENCOUNTER — Other Ambulatory Visit: Payer: Self-pay | Admitting: Urology

## 2022-07-23 NOTE — Progress Notes (Signed)
Talked with patient. Instructions given. Arrival time 1030. NPO after MN. Meds and hx reviewed. Wife is the driver

## 2022-07-27 ENCOUNTER — Ambulatory Visit (HOSPITAL_COMMUNITY): Payer: Medicare Other

## 2022-07-27 ENCOUNTER — Encounter (HOSPITAL_BASED_OUTPATIENT_CLINIC_OR_DEPARTMENT_OTHER)
Admission: RE | Disposition: A | Discharge status: Home / Self Care | Payer: Self-pay | Source: Home / Self Care | Attending: Urology

## 2022-07-27 ENCOUNTER — Other Ambulatory Visit: Payer: Self-pay

## 2022-07-27 ENCOUNTER — Ambulatory Visit (HOSPITAL_BASED_OUTPATIENT_CLINIC_OR_DEPARTMENT_OTHER)
Admission: RE | Admit: 2022-07-27 | Discharge: 2022-07-27 | Disposition: A | Discharge status: Home / Self Care | Payer: Medicare Other | Attending: Urology | Admitting: Urology

## 2022-07-27 ENCOUNTER — Encounter (HOSPITAL_BASED_OUTPATIENT_CLINIC_OR_DEPARTMENT_OTHER): Payer: Self-pay | Admitting: Urology

## 2022-07-27 DIAGNOSIS — N200 Calculus of kidney: Secondary | ICD-10-CM | POA: Diagnosis not present

## 2022-07-27 DIAGNOSIS — E119 Type 2 diabetes mellitus without complications: Secondary | ICD-10-CM | POA: Insufficient documentation

## 2022-07-27 DIAGNOSIS — F17210 Nicotine dependence, cigarettes, uncomplicated: Secondary | ICD-10-CM | POA: Diagnosis not present

## 2022-07-27 DIAGNOSIS — M160 Bilateral primary osteoarthritis of hip: Secondary | ICD-10-CM | POA: Diagnosis not present

## 2022-07-27 HISTORY — PX: EXTRACORPOREAL SHOCK WAVE LITHOTRIPSY: SHX1557

## 2022-07-27 LAB — GLUCOSE, CAPILLARY
Glucose-Capillary: 143 mg/dL — ABNORMAL HIGH (ref 70–99)
Glucose-Capillary: 130 mg/dL — ABNORMAL HIGH (ref 70–99)

## 2022-07-27 SURGERY — LITHOTRIPSY, ESWL
Anesthesia: LOCAL | Laterality: Right

## 2022-07-27 MED ORDER — SODIUM CHLORIDE 0.9 % IV SOLN: INTRAVENOUS | Status: DC

## 2022-07-27 MED ORDER — OXYCODONE-ACETAMINOPHEN 5-325 MG PO TABS: 1.0000 | ORAL_TABLET | ORAL | 0 refills | Status: DC | PRN

## 2022-07-27 MED ORDER — DIAZEPAM 5 MG PO TABS
ORAL_TABLET | ORAL | Status: AC
  Filled 2022-07-27: qty 2

## 2022-07-27 MED ORDER — CIPROFLOXACIN HCL 500 MG PO TABS
500.0000 mg | ORAL_TABLET | ORAL | Status: AC
  Administered 2022-07-27: 500 mg via ORAL

## 2022-07-27 MED ORDER — DIPHENHYDRAMINE HCL 25 MG PO CAPS
ORAL_CAPSULE | ORAL | Status: AC
  Filled 2022-07-27: qty 1

## 2022-07-27 MED ORDER — CIPROFLOXACIN HCL 500 MG PO TABS
ORAL_TABLET | ORAL | Status: AC
  Filled 2022-07-27: qty 1

## 2022-07-27 MED ORDER — DIPHENHYDRAMINE HCL 25 MG PO CAPS
25.0000 mg | ORAL_CAPSULE | ORAL | Status: AC
  Administered 2022-07-27: 25 mg via ORAL

## 2022-07-27 MED ORDER — DIAZEPAM 5 MG PO TABS
10.0000 mg | ORAL_TABLET | ORAL | Status: AC
  Administered 2022-07-27: 10 mg via ORAL

## 2022-07-27 NOTE — Discharge Instructions (Signed)

## 2022-07-27 NOTE — Op Note (Signed)
[facility] Operative Note  Treating Physician: Rexene Alberts, MD  Pre-op diagnosis: Right renal stone  Post-op diagnosis: Same   Procedure: Right ESWL  See Aris Everts OP note scanned into chart. Also because of the size, density, location and other factors that cannot be anticipated I feel this will likely be a staged procedure. This fact supersedes any indication in the scanned [place] stone operative note to the contrary.  [facility] R[facility]  Pager: [phone]

## 2022-07-27 NOTE — H&P (Signed)
Office Visit Report     07/16/2022   --------------------------------------------------------------------------------   Cory Walls  MRN: [id number]  DOB: 12/18/1945, 77 year old Male   PRIMARY CARE:  Ermalene Searing. Philip Aspen, MD  PRIMARY CARE FAX:  [phone]  REFERRING:  Daine Gravel, NP  PROVIDER:  Ellison Hughs, M.D.  LOCATION:  [facility] [phone]     --------------------------------------------------------------------------------   CC/HPI: Kidney stones   Cory Walls is a 77 year old male with a history of kidney stones.   04/28/21:  -CTSS from 04/25/21 revealed a 3 mm right proximal ureteral stone with mild hydronephrosis  -No prior history of kidney stones  -Pain marginally improved with percocet  -c/o nausea and has had one episode of emesis  -denies fever/chills, dysuria or hematuria  -Labs from 04/25/2021 revealed a serum creatinine of 1.03, WBC of 13.3 and a UA positive for RBCs and WBCs but no other signs of cystitis   05/14/2021: 77 year old male who presents for 2-week follow-up after diagnosis of a 3 mm proximal right sided stone that was causing pain. He has not seen a stone pass. Denies interval pain or discomfort. Denies gross hematuria and fevers and chills. He has not needed the use of any pain medication.   07/15/2021: 77 year old male who presents today for 6-week follow-up. He has had no interval complaints of flank pain or discomfort. He did stop using tamsulosin due to a syncopal episode. He feels his flow is adequate and has no voiding complaints. He denies gross hematuria, incomplete bladder emptying, and straining on urination.   07/16/22: The patient is here today for a routine follow-up. He has done well over the past year and denies interval stone passage, episodes of flank pain or hematuria. No voiding complaints today. Doing well.     ALLERGIES: Penicillin Tamsulosin - Dizziness, Syncope    MEDICATIONS: Aspirin  Fish  Oil  Losartan Potassium 25 mg tablet  Magnesium  Metformin Hcl Er 500 mg tablet, extended release 24 hr  Multivitamin  Occuvite  Pravastatin Sodium  Vitamin B12  Vitamin C  Vitamin E     GU PSH: Cystoscopy - 2017       PSH Notes: hirschsprungs, adhesions removed   NON-GU PSH: Anesth, Shoulder Replacement, Left Appendectomy Cholecystectomy (open) Inguinal Hernia Repair > 5 yrs     GU PMH: Flank Pain - 07/15/2021, - 05/14/2021, Pain secondary to an obstructing 3 mm proximal right ureteral stone, - 04/28/2021 Renal and ureteral calculus - 07/15/2021, - 04/28/2021 Ureteral obstruction secondary to calculous - 04/28/2021 Bladder tumor/neoplasm - 2017    NON-GU PMH: Diabetes Type 2 Hypercholesterolemia    FAMILY HISTORY: 34 daughters - Daughter Death of family member - Father, Mother Heart Attack - Father   SOCIAL HISTORY: Marital Status: Married Preferred Language: English; Ethnicity: Not Hispanic Or Latino; Race: White Current Smoking Status: Patient smokes occasionally.  Drinks 2 drinks per month.  Drinks 2 caffeinated drinks per day. 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    REVIEW OF SYSTEMS:    GU Review Male:   Patient denies frequent urination, hard to postpone urination, burning/ pain with urination, get up at night to urinate, leakage of urine, stream starts and stops, trouble starting your stream, have to strain to urinate , erection problems, and penile pain.  Gastrointestinal (Upper):   Patient denies nausea, vomiting, and indigestion/ heartburn.  Gastrointestinal (Lower):   Patient denies diarrhea and constipation.  Constitutional:   Patient denies fever, night sweats, weight loss, and fatigue.  Skin:   Patient denies skin rash/ lesion and itching.  Eyes:   Patient denies blurred vision and double vision.  Ears/ Nose/ Throat:   Patient denies sinus problems and sore throat.  Hematologic/Lymphatic:   Patient denies swollen glands and easy bruising.   Cardiovascular:   Patient denies leg swelling and chest pains.  Respiratory:   Patient denies cough and shortness of breath.  Endocrine:   Patient denies excessive thirst.  Musculoskeletal:   Patient denies back pain and joint pain.  Neurological:   Patient denies headaches and dizziness.  Psychologic:   Patient denies depression and anxiety.   VITAL SIGNS:      07/16/2022 08:13 AM  Weight 193 lb / 87.54 kg  Height 72 in / 182.88 cm  BP 131/74 mmHg  Pulse 61 /min  Temperature 97.3 F / 36.2 C  BMI 26.2 kg/m   Complexity of Data:  Records Review:   Previous Patient Records  X-Ray Review: KUB: Reviewed Films. Discussed With Patient.     PROCEDURES:         KUB - [id number]  A single view of the abdomen is obtained.      . Patient confirmed No Neulasta OnPro Device.   There is a 3 to 4 mm calcification in the upper portion of the right renal shadow. There is also a 2 to 3 mm calcification in the lower portion of the left renal shadow partially surrounded by his 12th rib. There are no calcifications seen along the course of either ureter or within the bladder. No bony or bowel abnormalities are noted.         Urinalysis Dipstick Dipstick Cont'd  Color: Yellow Bilirubin: Neg mg/dL  Appearance: Clear Ketones: Neg mg/dL  Specific Gravity: 1.025 Blood: Neg ery/uL  pH: <=5.0 Protein: Trace mg/dL  Glucose: Neg mg/dL Urobilinogen: 0.2 mg/dL    Nitrites: Neg    Leukocyte Esterase: Neg leu/uL    ASSESSMENT:      ICD-10 Details  1 GU:   Renal calculus - N20.0 Bilateral, Chronic, Worsening     PLAN:            Medications Stop Meds: Ketorolac Tromethamine 10 mg tablet 1 tablet PO Q 6 H PRN  Start: 04/28/2021  Discontinue: 07/16/2022  - Reason: finished           Orders         Schedule X-Rays: 1 Year - KUB  Return Visit/Planned Activity: 1 Year - Office Visit, Follow up MD  Return Visit/Planned Activity: Next Available Appointment - Schedule Surgery           Document Letter(s):  Created for Patient: Clinical Summary         Notes:   -The patient has plans to travel out of the country in the coming months and would like to proceed with right ESWL to address his right renal stone burden. If the calcification is still visible within the left renal shadow, he would likely want to proceed with treatment of that in the subsequent months.    -The risks, benefits and alternatives of RIGHT ESWL was discussed with the patient. I described the risks which include arrhythmia, kidney contusion, kidney hemorrhage, need for transfusion, back discomfort, flank ecchymosis, flank abrasion, inability to break up stone, inability to pass stone fragments, Steinstrasse, infection associated with obstructing stones, need for different surgical procedure and possible need for repeat shockwave lithotripsy. The patient voices understanding and wishes to proceed.    Urology  Preoperative H&P   Chief Complaint: Right renal stone  History of Present Illness: Cory Walls is a 77 y.o. male with a right renal stone here for right ESWL. Denies fevers, chills, dysuria.     Past Medical History:  Diagnosis Date   Arthritis    hands    Colon polyps    Diabetes mellitus without complication ([place])    type II   History of blood transfusion    age 4   History of kidney stones    Hyperlipidemia    Nausea and vomiting - with colonoscopy preps 05/21/2015    Past Surgical History:  Procedure Laterality Date   APPENDECTOMY     CHOLECYSTECTOMY     COLONOSCOPY     HERNIA REPAIR     Hirschsprung's resection  1961   REVERSE SHOULDER ARTHROPLASTY Left 06/17/2021   Procedure: REVERSE SHOULDER ARTHROPLASTY;  Surgeon: Marchia Bond, MD;  Location: [facility];  Service: Orthopedics;  Laterality: Left;    Allergies:  Allergies  Allergen Reactions   Moviprep [Peg-Kcl-Nacl-Nasulf-Na Asc-C] Nausea And Vomiting   Penicillins     REACTION: as child Has patient had a PCN reaction  causing immediate rash, facial/tongue/throat swelling, SOB or lightheadedness with hypotension: n/a Has patient had a PCN reaction causing severe rash involving mucus membranes or skin necrosis: n/a Has patient had a PCN reaction that required hospitalization: n/a Has patient had a PCN reaction occurring within the last 10 years: n/a    Family History  Problem Relation Age of Onset   Heart disease Father    Colon cancer Neg Hx    Stomach cancer Neg Hx    Colon polyps Neg Hx    Diabetes Neg Hx    Kidney disease Neg Hx    Rectal cancer Neg Hx     Social History:  reports that he has been smoking cigarettes and cigars. He has been smoking an average of .25 packs per day. He has never used smokeless tobacco. He reports current alcohol use of about 1.0 standard drink of alcohol per week. He reports that he does not use drugs.  ROS: A complete review of systems was performed.  All systems are negative except for pertinent findings as noted.  Physical Exam:  Vital signs in last 24 hours: Temp:  [97.7 F (36.5 C)] 97.7 F (36.5 C) (02/12 1110) Pulse Rate:  [54] 54 (02/12 1110) Resp:  [18] 18 (02/12 1110) BP: (136)/(61) 136/61 (02/12 1110) SpO2:  [100 %] 100 % (02/12 1110) Weight:  [89.6 kg] 89.6 kg (02/12 1110) Constitutional:  Alert and oriented, No acute distress Cardiovascular: Regular rate and rhythm Respiratory: Normal respiratory effort, Lungs clear bilaterally GI: Abdomen is soft, nontender, nondistended, no abdominal masses GU: No CVA tenderness Lymphatic: No lymphadenopathy Neurologic: Grossly intact, no focal deficits Psychiatric: Normal mood and affect  Laboratory Data:  No results for input(s): "WBC", "HGB", "HCT", "PLT" in the last 72 hours.  No results for input(s): "NA", "K", "CL", "GLUCOSE", "BUN", "CALCIUM", "CREATININE" in the last 72 hours.  Invalid input(s): "CO3"   Results for orders placed or performed during the hospital encounter of 07/27/22 (from the  past 24 hour(s))  Glucose, capillary     Status: Abnormal   Collection Time: 07/27/22 11:37 AM  Result Value Ref Range   Glucose-Capillary 143 (H) 70 - 99 mg/dL   Comment 1 Document in Chart    No results found for this or any previous visit (from the past 240 hour(s)).  Renal Function:  No results for input(s): "CREATININE" in the last 168 hours. CrCl cannot be calculated (Patient's most recent lab result is older than the maximum 21 days allowed.).  Radiologic Imaging: No results found.  I independently reviewed the above imaging studies.  Assessment and Plan Cory Walls is a 77 y.o. male with a right renal stone here for right ESWL.  The risks, benefits and alternatives of right ESWL was discussed with the patient. I described the risks which include arrhythmia, kidney contusion, kidney hemorrhage, need for transfusion, back discomfort, flank ecchymosis, flank abrasion, inability to fracture the stone, inability to pass stone fragments, Steinstrasse, infection associated with obstructing stones, need for an alternative surgical procedure and possible need for repeat shockwave lithotripsy.  The patient voices understanding and wishes to proceed.      Cory R. Tawnie Ehresman MD 07/27/2022, 12:47 PM  [facility] Pager: [phone]): [phone]

## 2022-07-28 ENCOUNTER — Encounter (HOSPITAL_BASED_OUTPATIENT_CLINIC_OR_DEPARTMENT_OTHER): Payer: Self-pay | Admitting: Urology

## 2022-07-31 ENCOUNTER — Other Ambulatory Visit: Payer: Self-pay | Admitting: Urology

## 2022-08-03 NOTE — Progress Notes (Signed)
Pre op phone call completed. Stopping Aspirin and supplements the morning of 2/20. NPO after midnight. Arrive at [facility].

## 2022-08-06 NOTE — H&P (Signed)
Kidney stones   Cory Walls is a 77 year old male with a history of kidney stones.   04/28/21:  -CTSS from 04/25/21 revealed a 3 mm right proximal ureteral stone with mild hydronephrosis  -No prior history of kidney stones  -Pain marginally improved with percocet  -c/o nausea and has had one episode of emesis  -denies fever/chills, dysuria or hematuria  -Labs from 04/25/2021 revealed a serum creatinine of 1.03, WBC of 13.3 and a UA positive for RBCs and WBCs but no other signs of cystitis   05/14/2021: 77 year old male who presents for 2-week follow-up after diagnosis of a 3 mm proximal right sided stone that was causing pain. He has not seen a stone pass. Denies interval pain or discomfort. Denies gross hematuria and fevers and chills. He has not needed the use of any pain medication.   07/15/2021: 77 year old male who presents today for 6-week follow-up. He has had no interval complaints of flank pain or discomfort. He did stop using tamsulosin due to a syncopal episode. He feels his flow is adequate and has no voiding complaints. He denies gross hematuria, incomplete bladder emptying, and straining on urination.   07/16/22: The patient is here today for a routine follow-up. He has done well over the past year and denies interval stone passage, episodes of flank pain or hematuria. No voiding complaints today. Doing well.     ALLERGIES: Penicillin Tamsulosin - Dizziness, Syncope    MEDICATIONS: Aspirin  Fish Oil  Losartan Potassium 25 mg tablet  Magnesium  Metformin Hcl Er 500 mg tablet, extended release 24 hr  Multivitamin  Occuvite  Pravastatin Sodium  Vitamin B12  Vitamin C  Vitamin E     GU PSH: Cystoscopy - 2017       PSH Notes: hirschsprungs, adhesions removed   NON-GU PSH: Anesth, Shoulder Replacement, Left Appendectomy Cholecystectomy (open) Inguinal Hernia Repair > 5 yrs     GU PMH: Flank Pain - 07/15/2021, - 05/14/2021, Pain secondary to an obstructing 3 mm  proximal right ureteral stone, - 04/28/2021 Renal and ureteral calculus - 07/15/2021, - 04/28/2021 Ureteral obstruction secondary to calculous - 04/28/2021 Bladder tumor/neoplasm - 2017    NON-GU PMH: Diabetes Type 2 Hypercholesterolemia    FAMILY HISTORY: 63 daughters - Daughter Death of family member - Father, Mother Heart Attack - Father   SOCIAL HISTORY: Marital Status: Married Preferred Language: English; Ethnicity: Not Hispanic Or Latino; Race: White Current Smoking Status: Patient smokes occasionally.  Drinks 2 drinks per month.  Drinks 2 caffeinated drinks per day. 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    REVIEW OF SYSTEMS:    GU Review Male:   Patient denies frequent urination, hard to postpone urination, burning/ pain with urination, get up at night to urinate, leakage of urine, stream starts and stops, trouble starting your stream, have to strain to urinate , erection problems, and penile pain.  Gastrointestinal (Upper):   Patient denies nausea, vomiting, and indigestion/ heartburn.  Gastrointestinal (Lower):   Patient denies diarrhea and constipation.  Constitutional:   Patient denies fever, night sweats, weight loss, and fatigue.  Skin:   Patient denies skin rash/ lesion and itching.  Eyes:   Patient denies blurred vision and double vision.  Ears/ Nose/ Throat:   Patient denies sinus problems and sore throat.  Hematologic/Lymphatic:   Patient denies swollen glands and easy bruising.  Cardiovascular:   Patient denies leg swelling and chest pains.  Respiratory:   Patient denies cough and shortness of breath.  Endocrine:  Patient denies excessive thirst.  Musculoskeletal:   Patient denies back pain and joint pain.  Neurological:   Patient denies headaches and dizziness.  Psychologic:   Patient denies depression and anxiety.   VITAL SIGNS:      07/16/2022 08:13 AM  Weight 193 lb / 87.54 kg  Height 72 in / 182.88 cm  BP 131/74 mmHg  Pulse 61 /min  Temperature  97.3 F / 36.2 C  BMI 26.2 kg/m   Complexity of Data:  Records Review:   Previous Patient Records  X-Ray Review: KUB: Reviewed Films. Discussed With Patient.     PROCEDURES:         KUB - [id number]  A single view of the abdomen is obtained.      . Patient confirmed No Neulasta OnPro Device.   There is a 3 to 4 mm calcification in the upper portion of the right renal shadow. There is also a 2 to 3 mm calcification in the lower portion of the left renal shadow partially surrounded by his 12th rib. There are no calcifications seen along the course of either ureter or within the bladder. No bony or bowel abnormalities are noted.         Urinalysis Dipstick Dipstick Cont'd  Color: Yellow Bilirubin: Neg mg/dL  Appearance: Clear Ketones: Neg mg/dL  Specific Gravity: 1.025 Blood: Neg ery/uL  pH: <=5.0 Protein: Trace mg/dL  Glucose: Neg mg/dL Urobilinogen: 0.2 mg/dL    Nitrites: Neg    Leukocyte Esterase: Neg leu/uL    ASSESSMENT:      ICD-10 Details  1 GU:   Renal calculus - N20.0 Bilateral, Chronic, Worsening     PLAN:            Medications Stop Meds: Ketorolac Tromethamine 10 mg tablet 1 tablet PO Q 6 H PRN  Start: 04/28/2021  Discontinue: 07/16/2022  - Reason: finished           Orders         Schedule X-Rays: 1 Year - KUB  Return Visit/Planned Activity: 1 Year - Office Visit, Follow up MD  Return Visit/Planned Activity: Next Available Appointment - Schedule Surgery          Document Letter(s):  Created for Patient: Clinical Summary         Notes:   -The patient has plans to travel out of the country in the coming months and would like to proceed with right ESWL to address his right renal stone burden. If the calcification is still visible within the left renal shadow, he would likely want to proceed with treatment of that in the subsequent months.    -The risks, benefits and alternatives of RIGHT ESWL was discussed with the patient. I described the risks which  include arrhythmia, kidney contusion, kidney hemorrhage, need for transfusion, back discomfort, flank ecchymosis, flank abrasion, inability to break up stone, inability to pass stone fragments, Steinstrasse, infection associated with obstructing stones, need for different surgical procedure and possible need for repeat shockwave lithotripsy. The patient voices understanding and wishes to proceed.

## 2022-08-07 ENCOUNTER — Ambulatory Visit (HOSPITAL_COMMUNITY): Payer: Medicare Other

## 2022-08-07 ENCOUNTER — Encounter (HOSPITAL_BASED_OUTPATIENT_CLINIC_OR_DEPARTMENT_OTHER)
Admission: RE | Disposition: A | Discharge status: Home / Self Care | Payer: Self-pay | Source: Home / Self Care | Attending: Urology

## 2022-08-07 ENCOUNTER — Other Ambulatory Visit: Payer: Self-pay

## 2022-08-07 ENCOUNTER — Encounter (HOSPITAL_BASED_OUTPATIENT_CLINIC_OR_DEPARTMENT_OTHER): Payer: Self-pay | Admitting: Urology

## 2022-08-07 ENCOUNTER — Ambulatory Visit (HOSPITAL_BASED_OUTPATIENT_CLINIC_OR_DEPARTMENT_OTHER)
Admission: RE | Admit: 2022-08-07 | Discharge: 2022-08-07 | Disposition: A | Discharge status: Home / Self Care | Payer: Medicare Other | Attending: Urology | Admitting: Urology

## 2022-08-07 DIAGNOSIS — N200 Calculus of kidney: Secondary | ICD-10-CM | POA: Diagnosis not present

## 2022-08-07 DIAGNOSIS — F17200 Nicotine dependence, unspecified, uncomplicated: Secondary | ICD-10-CM | POA: Insufficient documentation

## 2022-08-07 DIAGNOSIS — N132 Hydronephrosis with renal and ureteral calculous obstruction: Secondary | ICD-10-CM | POA: Diagnosis not present

## 2022-08-07 HISTORY — PX: EXTRACORPOREAL SHOCK WAVE LITHOTRIPSY: SHX1557

## 2022-08-07 LAB — GLUCOSE, CAPILLARY: Glucose-Capillary: 134 mg/dL — ABNORMAL HIGH (ref 70–99)

## 2022-08-07 SURGERY — LITHOTRIPSY, ESWL
Anesthesia: LOCAL | Laterality: Left

## 2022-08-07 MED ORDER — SODIUM CHLORIDE 0.9 % IV SOLN: INTRAVENOUS | Status: DC

## 2022-08-07 MED ORDER — DIAZEPAM 5 MG PO TABS
10.0000 mg | ORAL_TABLET | ORAL | Status: AC
  Administered 2022-08-07: 10 mg via ORAL

## 2022-08-07 MED ORDER — DIAZEPAM 5 MG PO TABS
ORAL_TABLET | ORAL | Status: AC
  Filled 2022-08-07: qty 2

## 2022-08-07 MED ORDER — DIPHENHYDRAMINE HCL 25 MG PO CAPS
ORAL_CAPSULE | ORAL | Status: AC
  Filled 2022-08-07: qty 1

## 2022-08-07 MED ORDER — DIPHENHYDRAMINE HCL 25 MG PO CAPS
25.0000 mg | ORAL_CAPSULE | ORAL | Status: AC
  Administered 2022-08-07: 25 mg via ORAL

## 2022-08-07 MED ORDER — CIPROFLOXACIN HCL 500 MG PO TABS
ORAL_TABLET | ORAL | Status: AC
  Filled 2022-08-07: qty 1

## 2022-08-07 MED ORDER — CIPROFLOXACIN HCL 500 MG PO TABS
500.0000 mg | ORAL_TABLET | ORAL | Status: AC
  Administered 2022-08-07: 500 mg via ORAL

## 2022-08-07 NOTE — Op Note (Signed)
[facility] Operative Note  Treating Physician: Remi Haggard, MD  Pre-op diagnosis: Left renal calculus  Post-op diagnosis: Same   Procedure: Left renal ESWL  [id number]  See Aris Everts OP note scanned into chart. Also because of the size, density, location and other factors that cannot be anticipated I feel this will likely be a staged procedure. This fact supersedes any indication in the scanned [place] [facility] operative note to the contrary.  [facility]

## 2022-08-07 NOTE — Interval H&P Note (Signed)
History and Physical Interval Note:  08/07/2022 10:26 AM  Cory Walls  has presented today for surgery, with the diagnosis of LEFT RENAL STONES.  The various methods of treatment have been discussed with the patient and family. After consideration of risks, benefits and other options for treatment, the patient has consented to  Procedure(s): EXTRACORPOREAL SHOCK WAVE LITHOTRIPSY ([facility]) (Left) as a surgical intervention.  The patient's history has been reviewed, patient examined, no change in status, stable for surgery.  I have reviewed the patient's chart and labs.  Questions were answered to the patient's satisfaction.     Remi Haggard

## 2022-08-10 ENCOUNTER — Encounter (HOSPITAL_BASED_OUTPATIENT_CLINIC_OR_DEPARTMENT_OTHER): Payer: Self-pay | Admitting: Urology

## 2022-08-11 DIAGNOSIS — N200 Calculus of kidney: Secondary | ICD-10-CM | POA: Diagnosis not present

## 2022-08-24 IMAGING — CT CT CHEST W/O CM
2 of 4 series · 14 of 36 positions shown, 17 images · non-contrast
Comparison: Chest x-ray 07/14/2007

CLINICAL DATA: Current smoker for 40 years. No prior history of
[id number].



[Series 2: routine chest without · axial · non-contrast · 0.73mm/px · z∈[+1170,+1452]mm · 11 of 167 slices shown, 14 images]
[im 13/167  mediastinal]
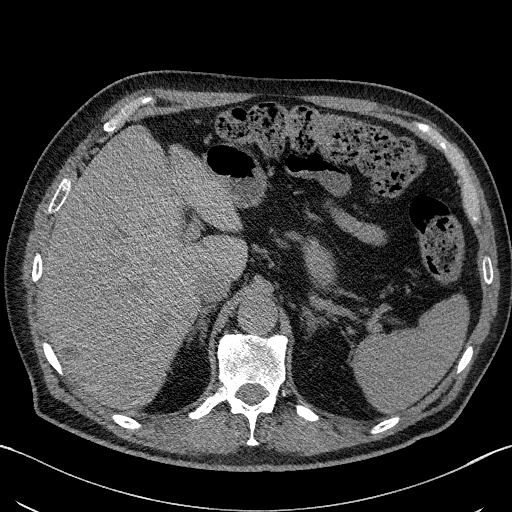
[im 13/167  lung]
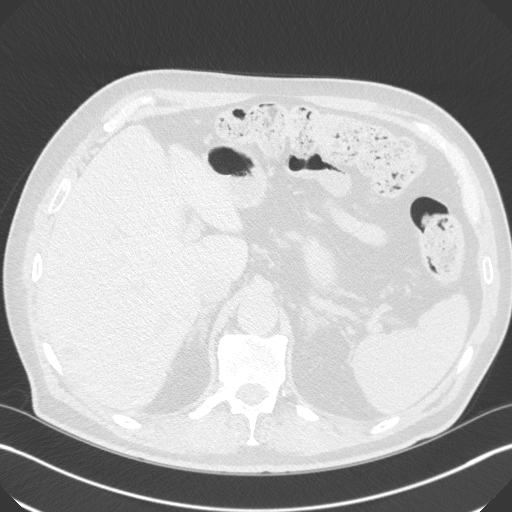
[im 26/167  lung]
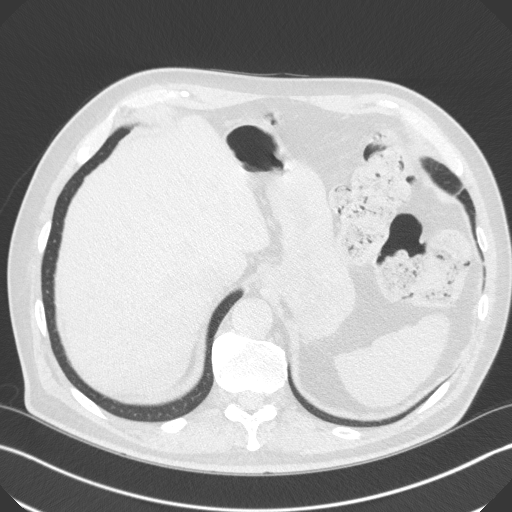
[im 39/167  lung]
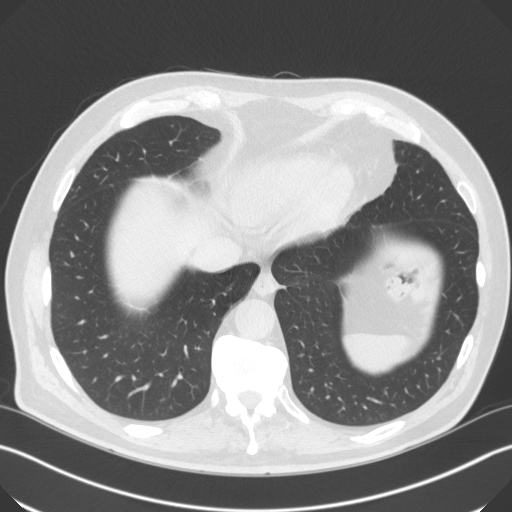
[im 52/167  lung]
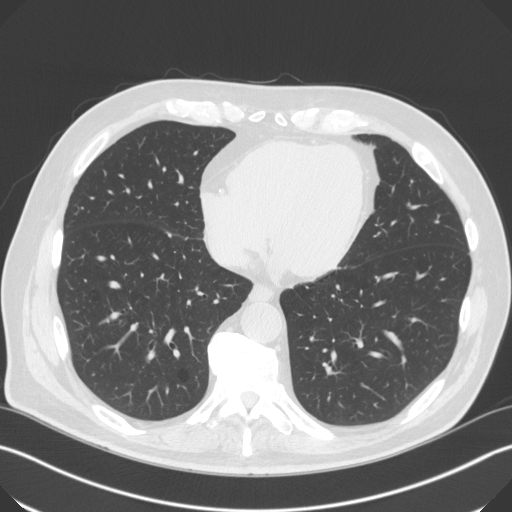
[im 64/167  mediastinal]
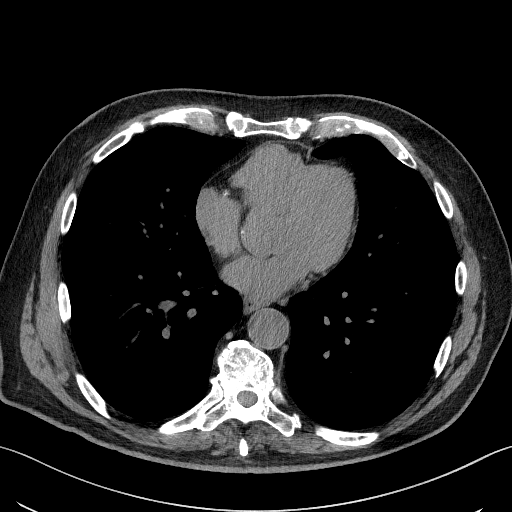
[im 64/167  lung]
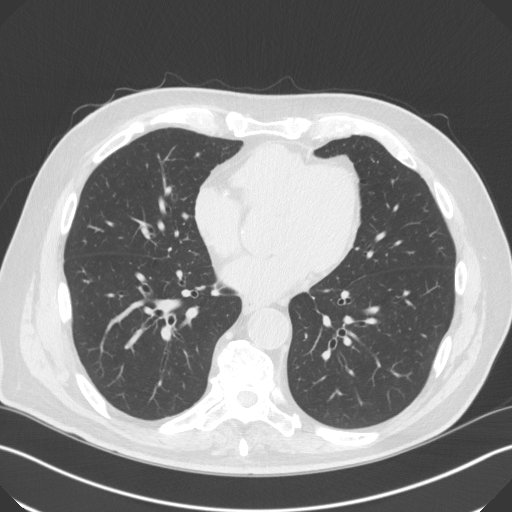
[im 90/167  lung]
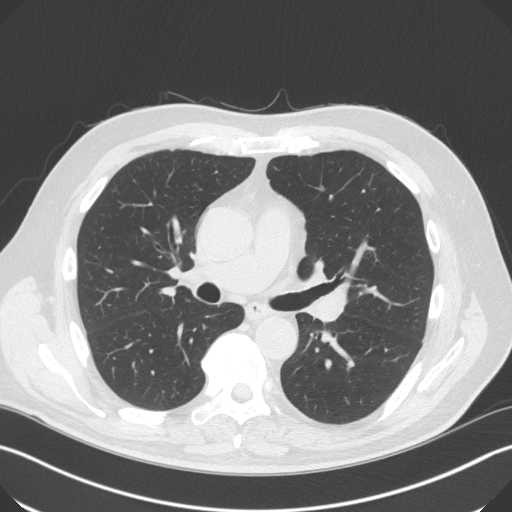
[im 103/167  lung]
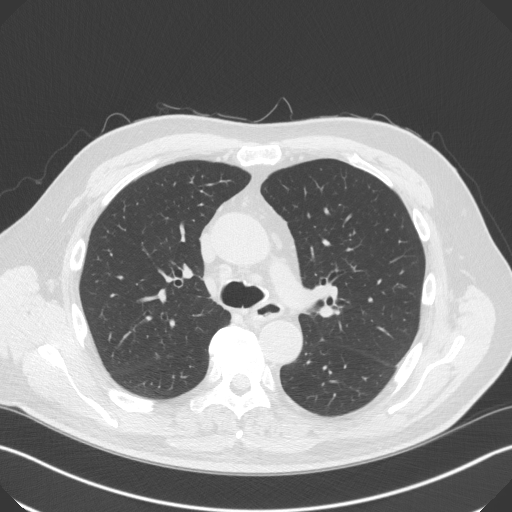
[im 115/167  lung]
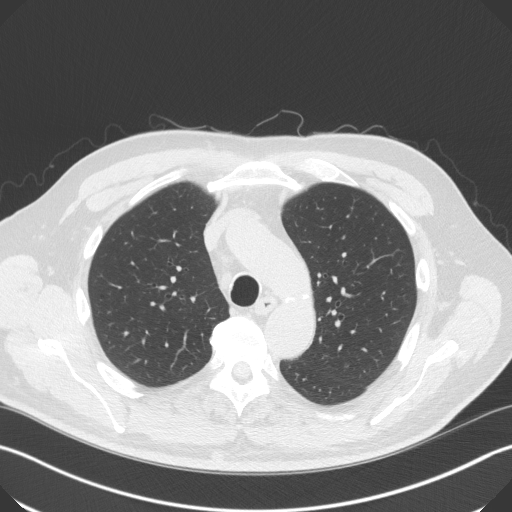
[im 128/167  mediastinal]
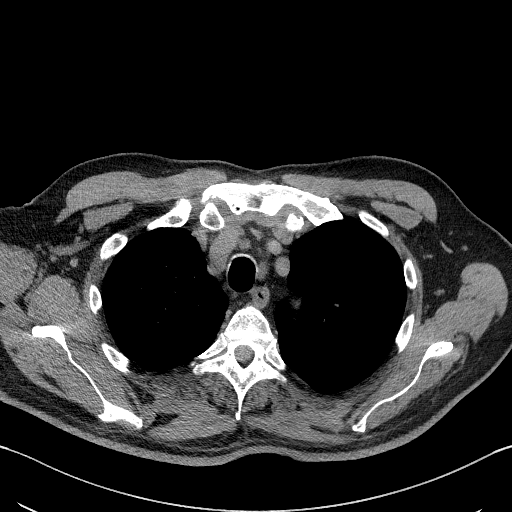
[im 128/167  lung]
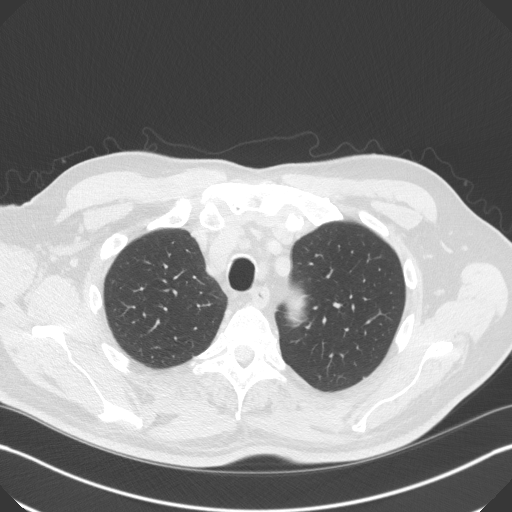
[im 141/167  lung]
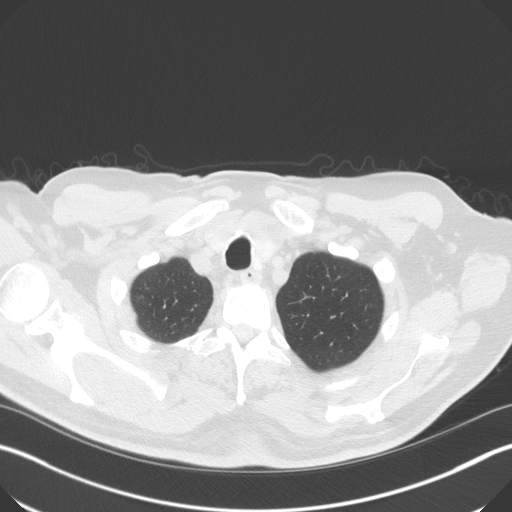
[im 154/167  lung]
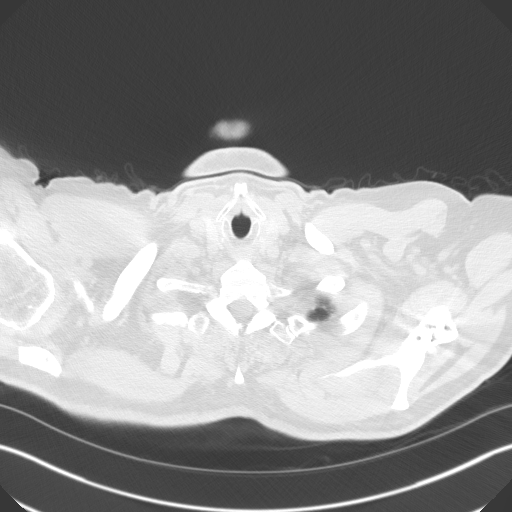

[Series 5: coronal · coronal · 0.70mm/px · 3 of 150 slices shown]
[im 30/150  lung]
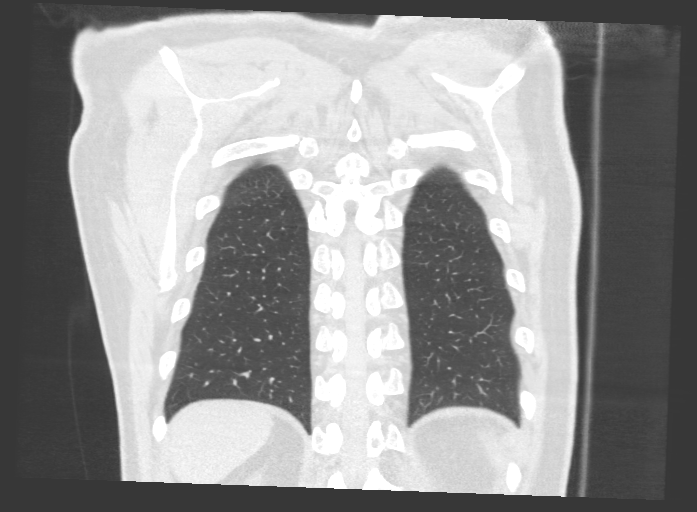
[im 60/150  lung]
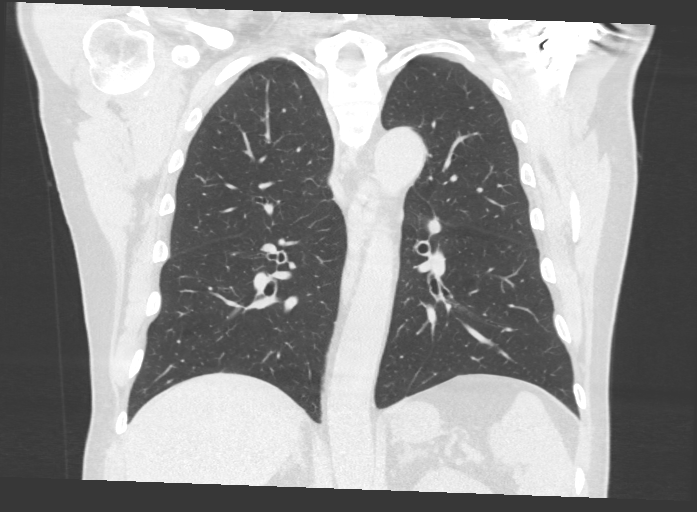
[im 90/150  lung]
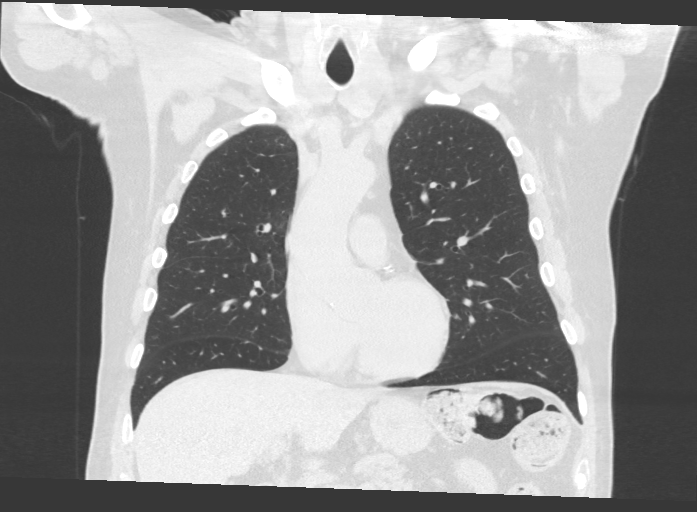

[14 of 36 positions shown; findings below may reference images not displayed]

FINDINGS: Cardiovascular: Normal heart size. No pericardial effusion. Thoracic
aortic atherosclerosis. Coronary artery atherosclerosis. Ascending
thoracic aortic aneurysm measuring 4 cm in diameter at the level of
the right main pulmonary artery.

Mediastinum/Nodes: Small mediastinal subcentimeter lymph nodes with
the largest right paratracheal lymph node measuring 9 mm in short
axis. No significant hilar lymphadenopathy. No axillary
lymphadenopathy. Heterogeneous left thyroid gland with a 2.2 cm
hypodense left thyroid nodule. Trachea is normal. Esophagus is
normal.

Lungs/Pleura: No focal consolidation. No pleural effusion or
pneumothorax. No significant pulmonary nodule or pulmonary mass.

Upper Abdomen: 710 mm hypodense, fluid attenuating right hepatic
mass peripherally consistent with a cyst. 12 mm hypodense, fluid
attenuating left hepatic mass likely reflecting a small cyst.

Musculoskeletal: Generalized osteopenia. Left shoulder arthroplasty.
No aggressive osseous lesion. Anterior bridging osteophytes of the
thoracolumbar spine as can be seen with diffuse idiopathic skeletal
hyperostosis. Lower cervical spine spondylosis partially visualized.
IMPRESSION: 1. No significant pulmonary nodule or pulmonary mass. No acute
cardiopulmonary disease.
2. Ascending thoracic aortic aneurysm measuring 4 cm in diameter at
the level of the right main pulmonary artery. Recommend annual
imaging followup by CTA or MRA. This recommendation follows 7888
ACCF/AHA/AATS/ACR/ASA/SCA/NYA/YANTO/MEAS/HJ RAZALI Guidelines for the
Diagnosis and Management of Patients with Thoracic Aortic Disease.
Circulation. 7888; 121: E266-e369. Aortic aneurysm NOS (6WYGJ-PLK.8)
3. Heterogeneous left thyroid gland with a 2.2 cm hypodense left
thyroid nodule. Recommend thyroid US (ref: [HOSPITAL]. 1948
4. Aortic Atherosclerosis (6WYGJ-935.5).

## 2022-08-26 DIAGNOSIS — S335XXA Sprain of ligaments of lumbar spine, initial encounter: Secondary | ICD-10-CM | POA: Diagnosis not present

## 2022-08-26 DIAGNOSIS — S20211A Contusion of right front wall of thorax, initial encounter: Secondary | ICD-10-CM | POA: Diagnosis not present

## 2022-09-04 DIAGNOSIS — N200 Calculus of kidney: Secondary | ICD-10-CM | POA: Diagnosis not present

## 2022-09-04 IMAGING — US US THYROID
1 series · 12 of 25 positions shown · non-contrast
Comparison: Chest CT-10/02/2021

CLINICAL DATA: Incidental on CT. Left-sided thyroid nodule
incidentally noted on chest CT.

EXAM:
THYROID ULTRASOUND
TECHNIQUE: Ultrasound examination of the thyroid gland and adjacent soft
tissues was performed.

[Series 1: us thyroid · 12 of 57 slices shown]
[im 3/57]
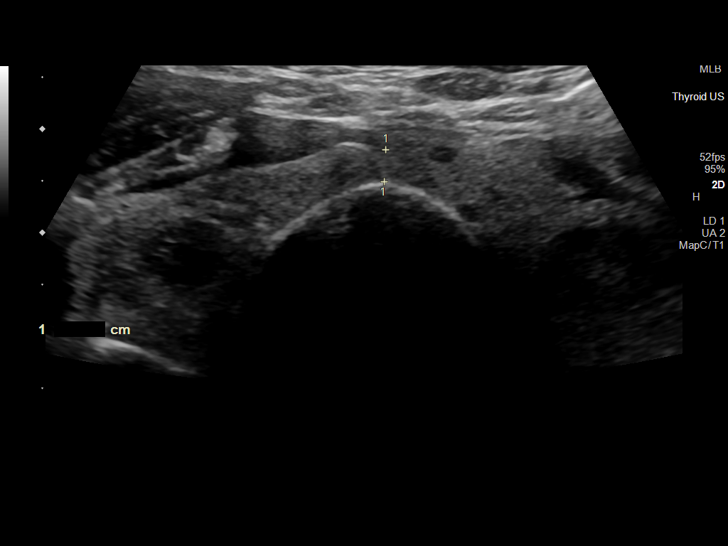
[im 8/57]
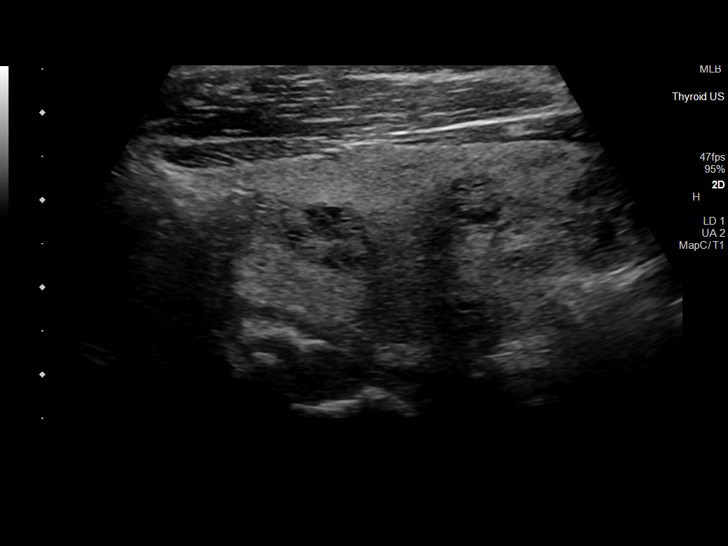
[im 12/57]
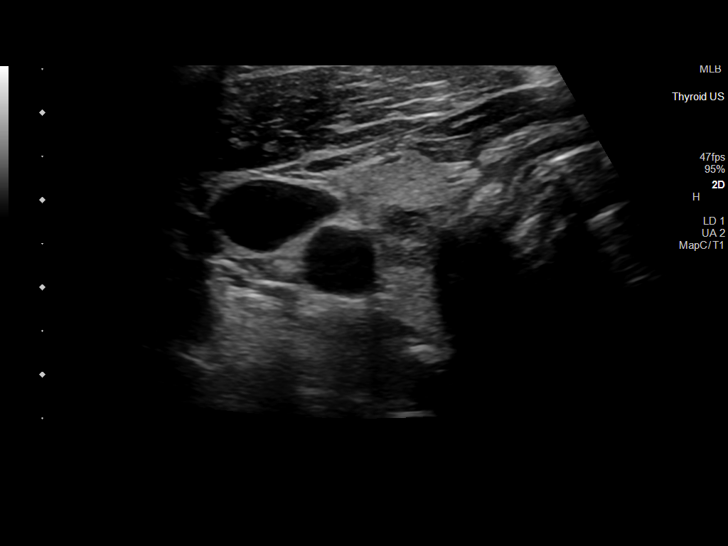
[im 17/57]
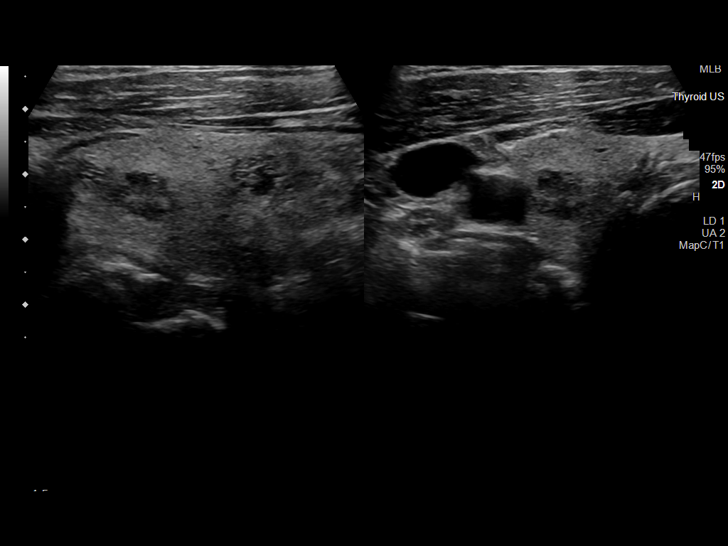
[im 22/57]
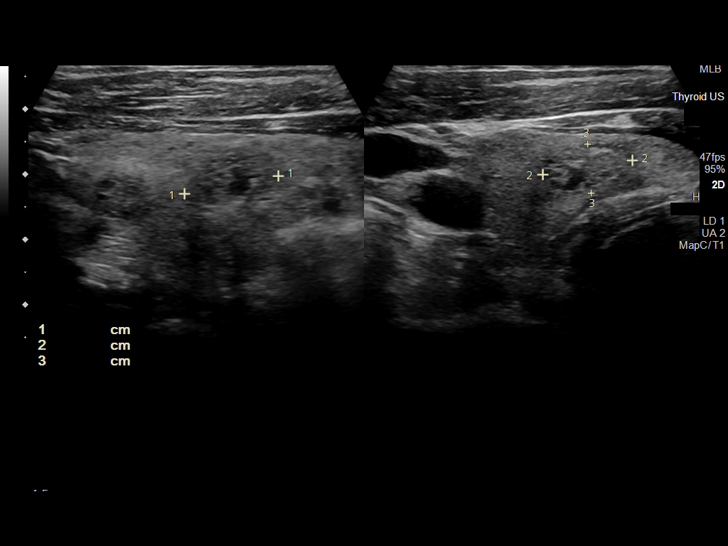
[im 26/57]
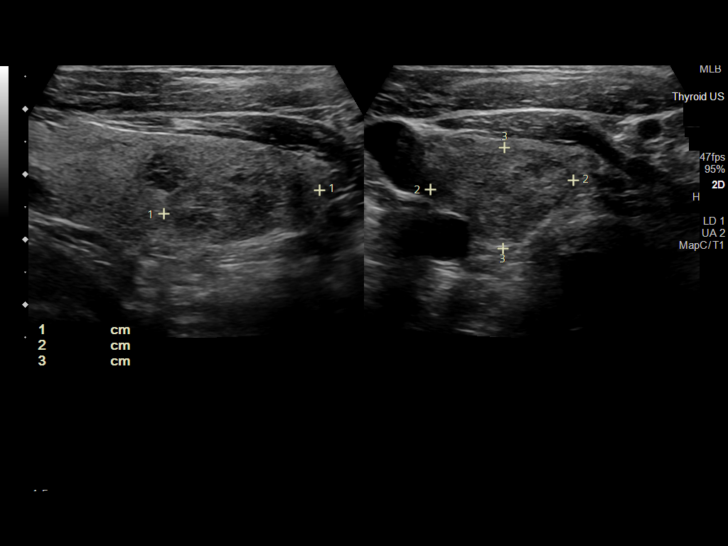
[im 31/57]
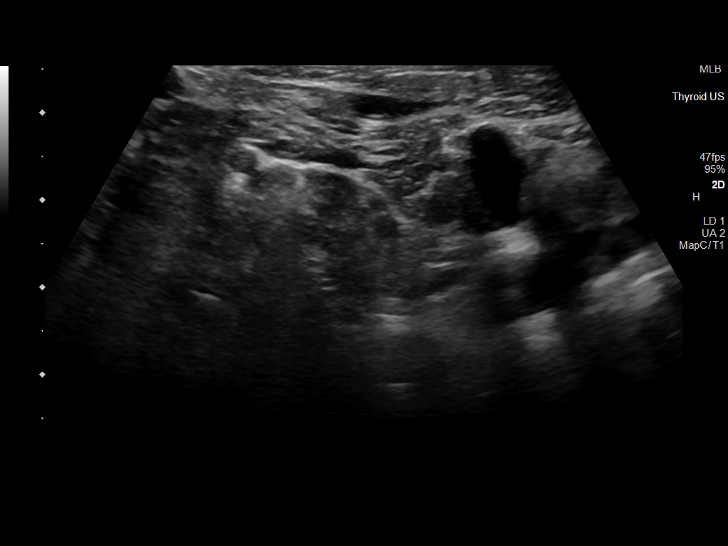
[im 36/57]
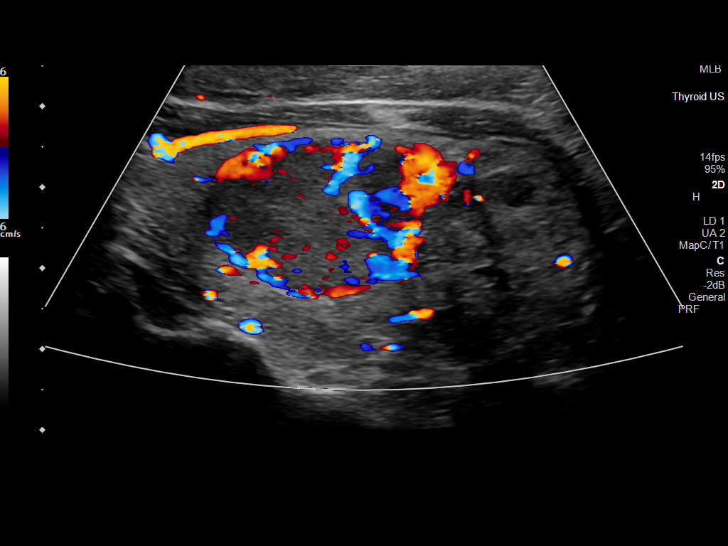
[im 40/57]
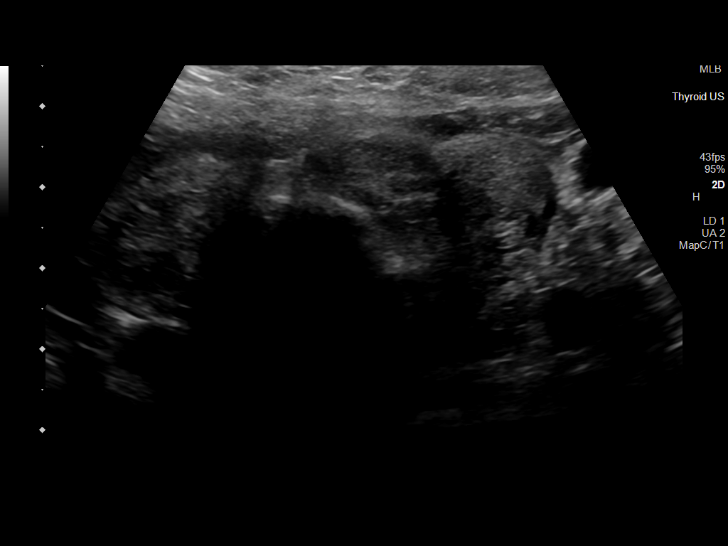
[im 45/57]
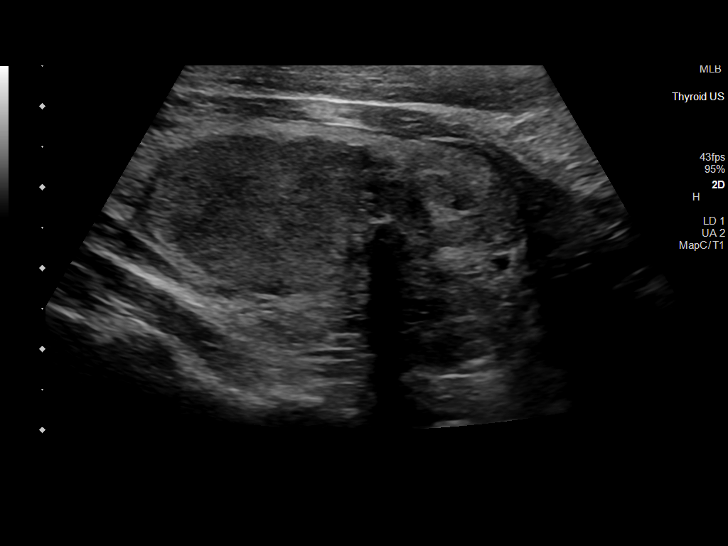
[im 50/57]
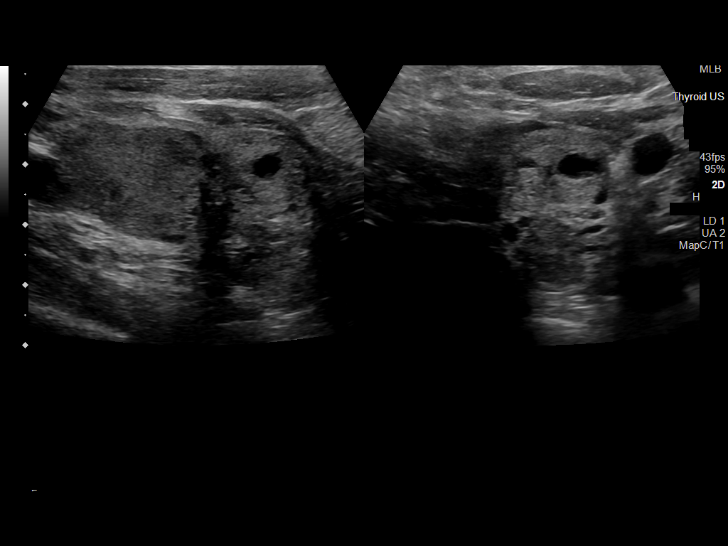
[im 54/57]
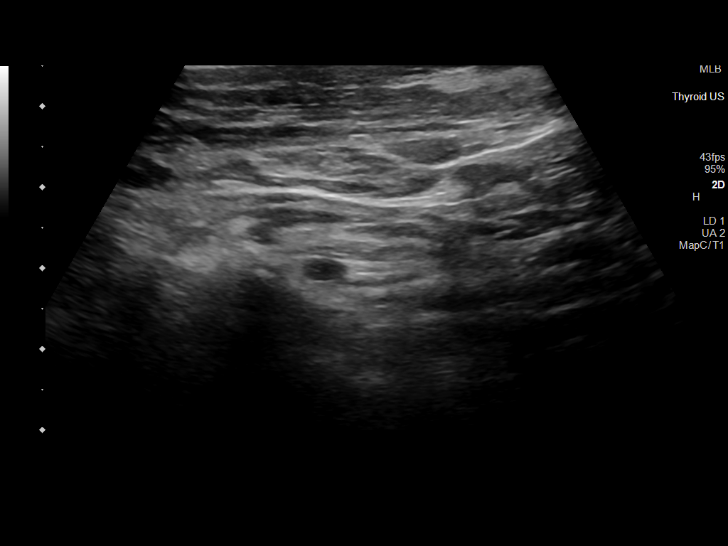

[12 of 25 positions shown; findings below may reference images not displayed]

FINDINGS: Parenchymal Echotexture: Moderately heterogenous

Isthmus: Normal in size measures 0.3 cm in diameter

Right lobe: Normal in size measuring 5.9 x 2.3 x 2.1 cm

Left lobe: Normal in size measuring 6.6 x 3.0 x 2.3 cm

_________________________________________________________

Estimated total number of nodules >/= 1 cm: 6-10

Number of spongiform nodules >/=  2 cm not described below (TR1): 0

Number of mixed cystic and solid nodules >/= 1.5 cm not described
below (TR2): 0

_________________________________________________________

Nodule # 1:

Location: Right; Superior

Maximum size: 1.3 cm; Other 2 dimensions: 1.2 x 0.9 cm

Composition: solid/almost completely solid (2)

Echogenicity: hypoechoic (2)

Shape: not taller-than-wide (0)

Margins: ill-defined (0)

Echogenic foci: none (0)

ACR TI-RADS total points: 4.

ACR TI-RADS risk category: TR4 (4-6 points).

ACR TI-RADS recommendations:

*Given size (>/= 1 - 1.4 cm) and appearance, a follow-up ultrasound
in 1 year should be considered based on TI-RADS criteria.

_________________________________________________________

There is an approximately 1.5 x 1.4 x 0.8 cm
spongiform/benign-appearing nodule within mid aspect the right lobe
of the thyroid (labeled 2), which does not meet criteria to
recommend percutaneous sampling or continued dedicated follow-up.

_________________________________________________________

Nodule # 3:

Location: Right; Inferior

Maximum size: 2.4 cm; Other 2 dimensions: 2.2 x 1.6 cm

Composition: solid/almost completely solid (2)

Echogenicity: isoechoic (1)

Shape: not taller-than-wide (0)

Margins: ill-defined (0)

Echogenic foci: none (0)

ACR TI-RADS total points: 3.

ACR TI-RADS risk category: TR3 (3 points).

ACR TI-RADS recommendations:

*Given size (>/= 1.5 - 2.4 cm) and appearance, a follow-up
ultrasound in 1 year should be considered based on TI-RADS criteria.

_________________________________________________________

_________________________________________________________

Nodule # 4:

Location: Left; Mid - correlates with the nodule seen on preceding
chest CT

Maximum size: 3.5 cm; Other 2 dimensions: 2.5 x 2.1 cm

Composition: solid/almost completely solid (2)

Echogenicity: hypoechoic (2)

Shape: not taller-than-wide (0)

Margins: smooth (0)

Echogenic foci: macrocalcifications (1)

ACR TI-RADS total points: 5.

ACR TI-RADS risk category: TR4 (4-6 points).

ACR TI-RADS recommendations:

**Given size (>/= 1.5 cm) and appearance, fine needle aspiration of
this moderately suspicious nodule should be considered based on
TI-RADS criteria.

_________________________________________________________

There is an approximately 1.2 x 1.0 x 0.9 cm
spongiform/benign-appearing nodule within the inferior pole of the
left lobe of the thyroid (labeled 5) which does not meet criteria to
recommend percutaneous sampling or continued dedicated follow-up

_________________________________________________________

Nodule # 6:

Location: Left; Inferior

Maximum size: 1.7 cm; Other 2 dimensions: 1.7 x 1.3 cm

Composition: solid/almost completely solid (2)

Echogenicity: isoechoic (1)

Shape: not taller-than-wide (0)

Margins: ill-defined (0)

Echogenic foci: none (0)

ACR TI-RADS total points: 3.

ACR TI-RADS risk category: TR3 (3 points).

ACR TI-RADS recommendations:

*Given size (>/= 1.5 - 2.4 cm) and appearance, a follow-up
ultrasound in 1 year should be considered based on TI-RADS criteria.

_________________________________________________________
IMPRESSION: 1. Findings suggestive of multinodular goiter.
2. Nodule labeled #4, correlating with the nodule seen on preceding
chest CT, meets imaging criteria to recommend percutaneous sampling
as indicated.
3. Nodules labeled #1, #3 and #6 all meet imaging criteria to
recommend a 1 year follow-up.

The above is in keeping with the ACR TI-RADS recommendations - [HOSPITAL] 3921;[DATE].

## 2022-09-20 IMAGING — US US FNA BIOPSY THYROID 1ST LESION
1 series · 13 of 13 positions shown · non-contrast
Comparison: Ultrasound 10/13/2021

MEDICATIONS:
None

COMPLICATIONS:
None immediate.

INDICATION: Indeterminate thyroid nodule

EXAM:
ULTRASOUND GUIDED FINE NEEDLE ASPIRATION OF INDETERMINATE THYROID
NODULE
TECHNIQUE: Informed written consent was obtained from the patient after a
discussion of the risks, benefits and alternatives to treatment.
Questions regarding the procedure were encouraged and answered. A
timeout was performed prior to the initiation of the procedure.

[Series 1: us fna biopsy thyroid 1st lesion · 0.06mm/px · 13 acquisitions, 13 frames shown]
[im 1/13]
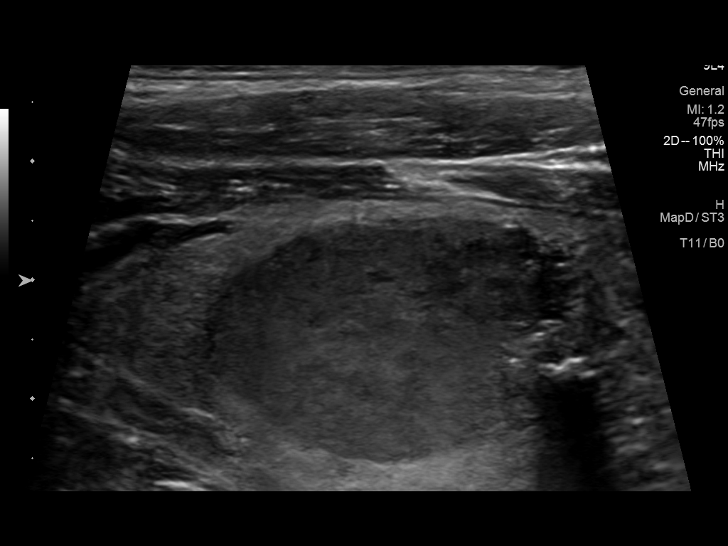
[im 2/13]
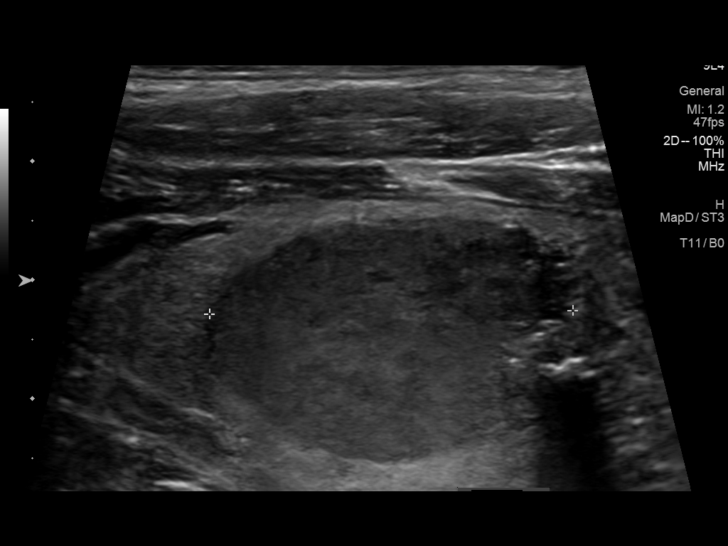
[im 3/13]
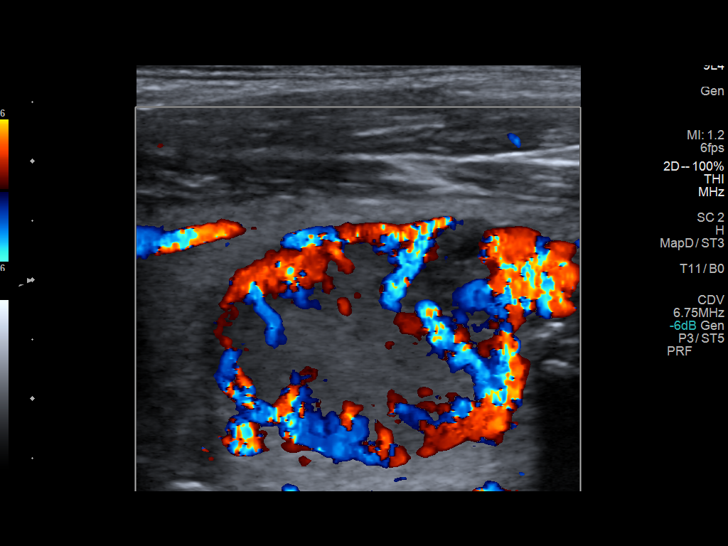
[im 4/13]
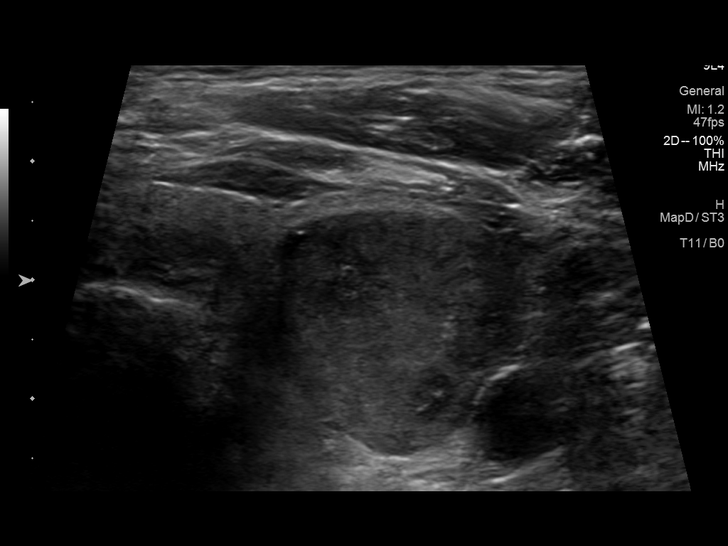
[im 5/13]
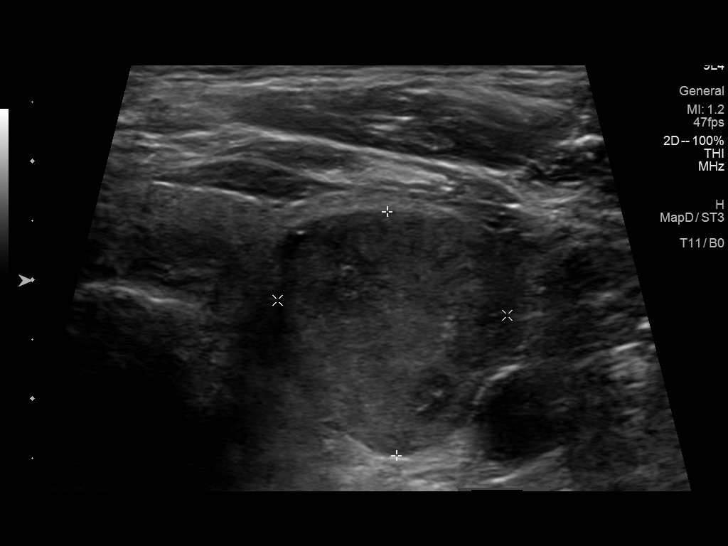
[im 6/13]
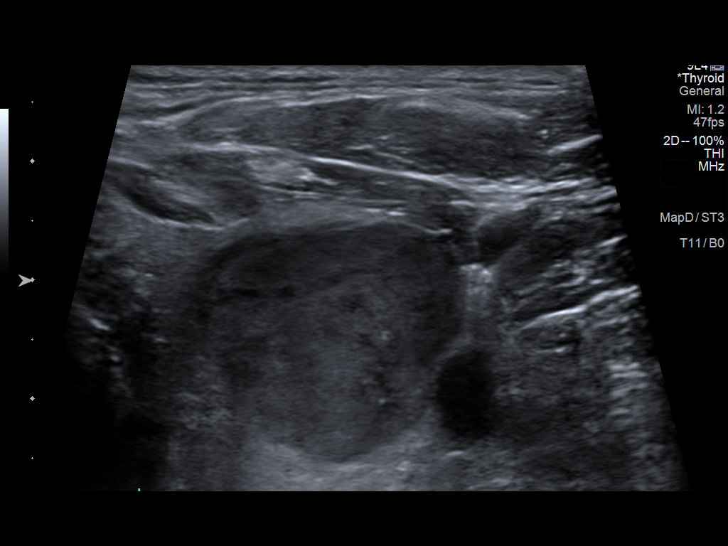
[im 7/13]
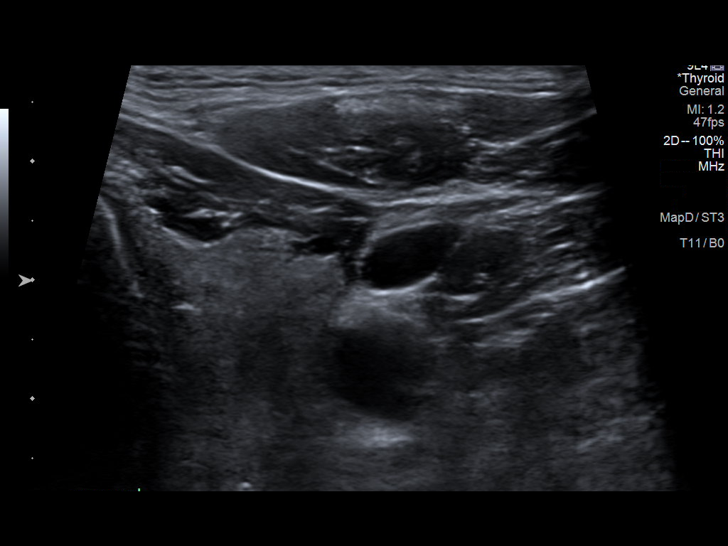
[im 8/13]
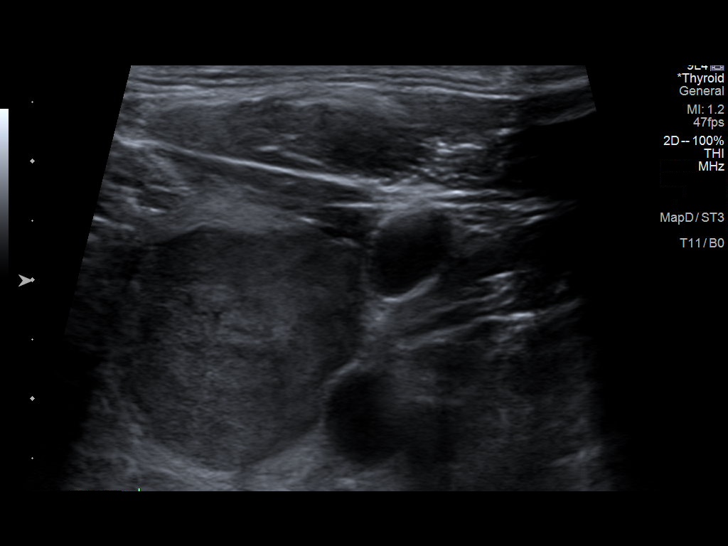
[im 9/13]
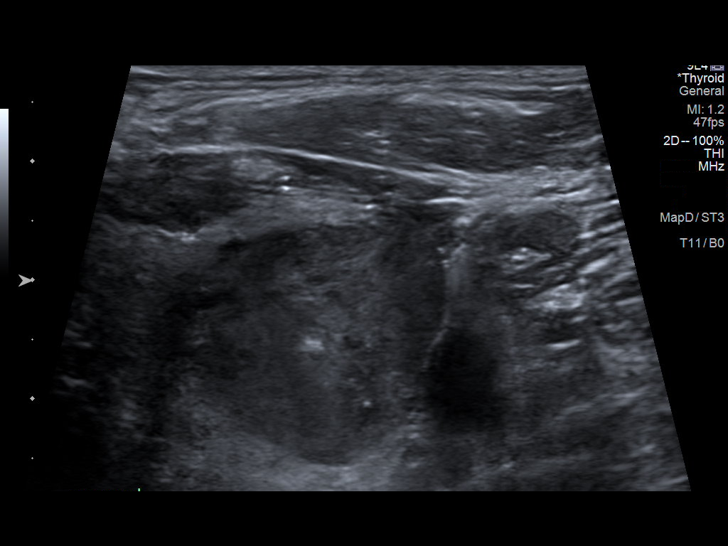
[im 10/13]
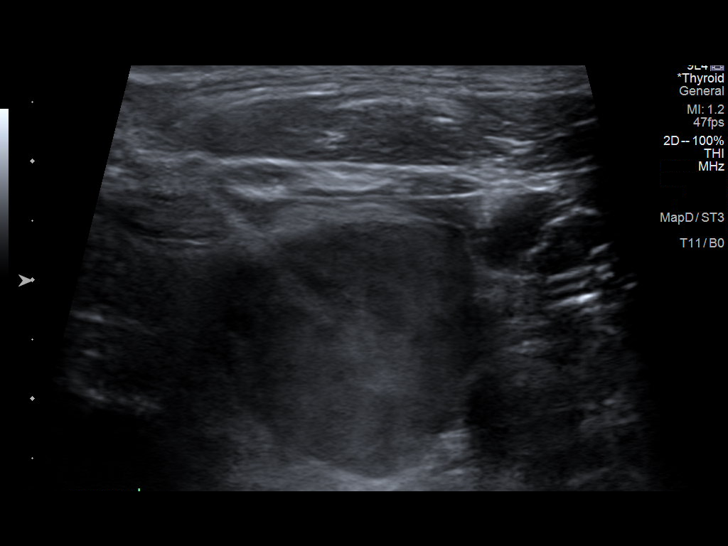
[im 11/13]
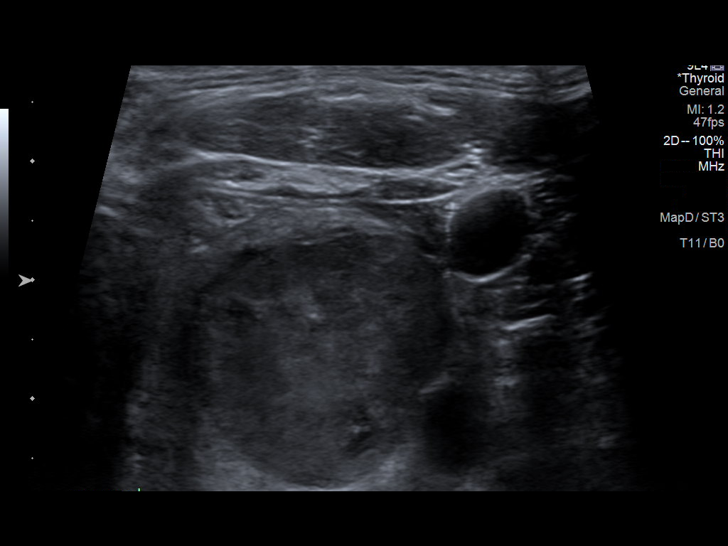
[im 12/13]
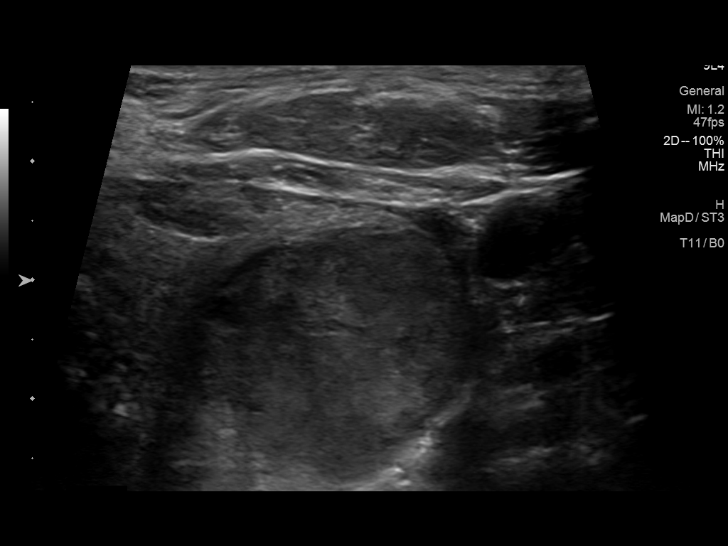
[im 13/13]
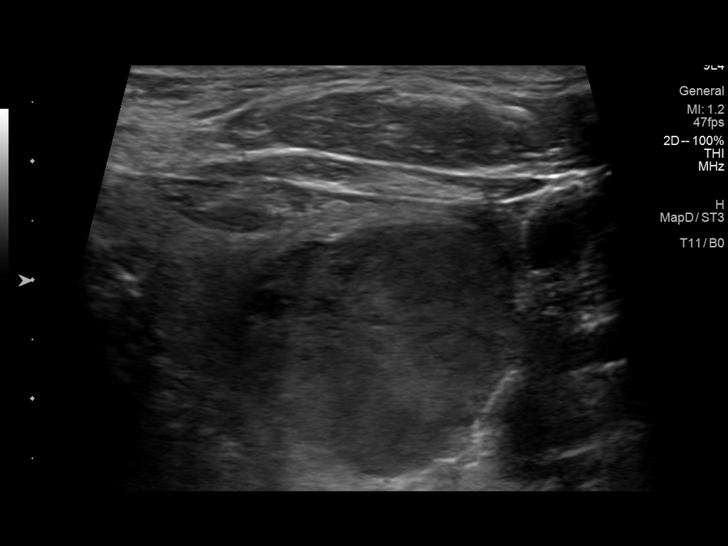

[13 of 13 positions shown; findings below may reference images not displayed]

Pre-procedural ultrasound scanning demonstrated unchanged size and
appearance of the indeterminate nodule within the left thyroid lobe

The procedure was planned. The neck was prepped in the usual sterile
fashion, and a sterile drape was applied covering the operative
field. A timeout was performed prior to the initiation of the
procedure. Local anesthesia was provided with 1% lidocaine.

Under direct ultrasound guidance, 5 FNA biopsies were performed of
the nodule with a 27 gauge needle. Multiple ultrasound images were
saved for procedural documentation purposes. The samples were
prepared and submitted to pathology. Two of the specimens were
reserved for Afirma testing.

Limited post procedural scanning was negative for hematoma or
additional complication. Dressings were placed. The patient
tolerated the above procedures procedure well without immediate
postprocedural complication.
FINDINGS: Nodule reference number based on prior diagnostic ultrasound: 4

Maximum size: 3.5 cm

Location: Left; Mid

ACR TI-RADS risk category: TR4 (4-6 points)

Reason for biopsy: meets ACR TI-RADS criteria

Ultrasound imaging confirms appropriate placement of the needles
within the thyroid nodule.
IMPRESSION: Technically successful ultrasound guided fine needle aspiration of
left mid lobe thyroid nodule

Performed and read by [place], Maria Mery

## 2022-09-28 DIAGNOSIS — M545 Low back pain, unspecified: Secondary | ICD-10-CM | POA: Diagnosis not present

## 2022-09-28 DIAGNOSIS — M25512 Pain in left shoulder: Secondary | ICD-10-CM | POA: Diagnosis not present

## 2022-09-29 NOTE — Progress Notes (Unsigned)
  Cardiology Office Note:   Date:  10/01/2022  ID:  Cory Walls, DOB [id number], MRN [id number]  History of Present Illness:   Cory Walls is a 77 y.o. male DMII, coronary artery Ca, and HLD who was referred by Dr. Eloise Harman for further evaluation of bradycardia.   Patient seen by Dr. Eloise Harman on 09/18/22 where he was noted to be bradycardic to the 50s. Was asymptomatic at that time.   Today, the patient overall feels well. States that he has had some dizzy episodes when going from a seated to a standing position. Symptoms only occur when trying to get out of the recliner. No syncope but has fallen before. Remains active is able to spin, lift weights, elliptical without issue. HR augments with activity. No exertional symptoms. No chest pain, orthopnea, PND, LE edema, or palpitations.   Past Medical History:  Diagnosis Date   Arthritis    hands    Colon polyps    Diabetes mellitus without complication    type II   History of blood transfusion    age 60   History of kidney stones    Hyperlipidemia    Nausea and vomiting - with colonoscopy preps 05/21/2015     ROS: As per HPI  Studies Reviewed:    EKG:  SB, LVH, inferior q, HR 58-personally reviewed       Risk Assessment/Calculations:              Physical Exam:   VS:  BP 120/70   Pulse (!) 58   Ht 6' (1.829 m)   Wt 194 lb (88 kg)   SpO2 95%   BMI 26.31 kg/m    Wt Readings from Last 3 Encounters:  10/01/22 194 lb (88 kg)  08/07/22 193 lb 14.4 oz (88 kg)  07/27/22 197 lb 8 oz (89.6 kg)     GEN: Well nourished, well developed in no acute distress NECK: No JVD; No carotid bruits CARDIAC: Bradycardic, regular, no murmurs, rubs, gallops RESPIRATORY:  Clear to auscultation without rales, wheezing or rhonchi  ABDOMEN: Soft, non-tender, non-distended EXTREMITIES:  No edema; No deformity   ASSESSMENT AND PLAN:   #Asymptomatic Bradycardia: -Active without exertional symptoms and overall doing well -Not on nodal  agents -Will monitor   #Abnormal ECG: -Patient with inferior q waves on ECG; no anginal symptoms and patient is very active -Will check TTE and exercise myoview to evaluate further per patient preference -Continue pravastatin  daily  #Suspected Orthostasis: -Patient with dizziness with position changes; no syncope -Will discuss with PCP about lowering dose of losartan to 12.5mg  daily -Increase hydration -Slow position changes  #Coronary Artery Ca: #HLD: -Continue pravastatin  daily  #DMII: -On metformin     Shared Decision Making/Informed Consent The risks [chest pain, shortness of breath, cardiac arrhythmias, dizziness, blood pressure fluctuations, myocardial infarction, stroke/transient ischemic attack, nausea, vomiting, allergic reaction, radiation exposure, metallic taste sensation and life-threatening complications (estimated to be 1 in 10,000)], benefits (risk stratification, diagnosing coronary artery disease, treatment guidance) and alternatives of a nuclear stress test were discussed in detail with Cory Walls and he agrees to proceed.   Signed, Meriam Sprague, MD

## 2022-10-01 ENCOUNTER — Ambulatory Visit: Payer: Medicare Other | Attending: Cardiology | Admitting: Cardiology

## 2022-10-01 ENCOUNTER — Encounter: Payer: Self-pay | Admitting: Cardiology

## 2022-10-01 ENCOUNTER — Encounter: Payer: Self-pay | Admitting: *Deleted

## 2022-10-01 VITALS — BP 120/70 | HR 58 | Ht 72.0 in | Wt 194.0 lb

## 2022-10-01 DIAGNOSIS — R9431 Abnormal electrocardiogram [ECG] [EKG]: Secondary | ICD-10-CM | POA: Diagnosis not present

## 2022-10-01 DIAGNOSIS — I2510 Atherosclerotic heart disease of native coronary artery without angina pectoris: Secondary | ICD-10-CM | POA: Insufficient documentation

## 2022-10-01 DIAGNOSIS — R001 Bradycardia, unspecified: Secondary | ICD-10-CM | POA: Diagnosis not present

## 2022-10-01 DIAGNOSIS — E118 Type 2 diabetes mellitus with unspecified complications: Secondary | ICD-10-CM | POA: Diagnosis not present

## 2022-10-01 DIAGNOSIS — I951 Orthostatic hypotension: Secondary | ICD-10-CM

## 2022-10-01 DIAGNOSIS — I2584 Coronary atherosclerosis due to calcified coronary lesion: Secondary | ICD-10-CM | POA: Diagnosis not present

## 2022-10-01 DIAGNOSIS — E782 Mixed hyperlipidemia: Secondary | ICD-10-CM | POA: Diagnosis not present

## 2022-10-01 NOTE — Patient Instructions (Signed)
Medication Instructions:   Your physician recommends that you continue on your current medications as directed. Please refer to the Current Medication list given to you today.  *If you need a refill on your cardiac medications before your next appointment, please call your pharmacy*   Testing/Procedures:  Your physician has requested that you have an echocardiogram. Echocardiography is a painless test that uses sound waves to create images of your heart. It provides your doctor with information about the size and shape of your heart and how well your heart's chambers and valves are working. This procedure takes approximately one hour. There are no restrictions for this procedure. Please do NOT wear cologne, perfume, aftershave, or lotions (deodorant is allowed). Please arrive 15 minutes prior to your appointment time.   Your physician has requested that you have en exercise stress myoview. For further information please visit [email or web address] Please follow instruction sheet, as given.    Follow-Up: At [facility], you and your health needs are our priority.  As part of our continuing mission to provide you with exceptional heart care, we have created designated Provider Care Teams.  These Care Teams include your primary Cardiologist (physician) and Advanced Practice Providers (APPs -  Physician Assistants and Nurse Practitioners) who all work together to provide you with the care you need, when you need it.  We recommend signing up for the patient portal called "MyChart".  Sign up information is provided on this After Visit Summary.  MyChart is used to connect with patients for Virtual Visits (Telemedicine).  Patients are able to view lab/test results, encounter notes, upcoming appointments, etc.  Non-urgent messages can be sent to your provider as well.   To learn more about what you can do with MyChart, go to [email or web address].    Your next appointment:   1  year(s)  Provider:   Dr. Shari Prows

## 2022-10-05 ENCOUNTER — Other Ambulatory Visit: Payer: Self-pay | Admitting: Internal Medicine

## 2022-10-05 DIAGNOSIS — M10079 Idiopathic gout, unspecified ankle and foot: Secondary | ICD-10-CM | POA: Diagnosis not present

## 2022-10-05 DIAGNOSIS — I700 Atherosclerosis of aorta: Secondary | ICD-10-CM | POA: Diagnosis not present

## 2022-10-05 DIAGNOSIS — E782 Mixed hyperlipidemia: Secondary | ICD-10-CM | POA: Diagnosis not present

## 2022-10-05 DIAGNOSIS — M180 Bilateral primary osteoarthritis of first carpometacarpal joints: Secondary | ICD-10-CM | POA: Diagnosis not present

## 2022-10-05 DIAGNOSIS — F17210 Nicotine dependence, cigarettes, uncomplicated: Secondary | ICD-10-CM | POA: Diagnosis not present

## 2022-10-05 DIAGNOSIS — S39012D Strain of muscle, fascia and tendon of lower back, subsequent encounter: Secondary | ICD-10-CM | POA: Diagnosis not present

## 2022-10-05 DIAGNOSIS — R030 Elevated blood-pressure reading, without diagnosis of hypertension: Secondary | ICD-10-CM | POA: Diagnosis not present

## 2022-10-05 DIAGNOSIS — E119 Type 2 diabetes mellitus without complications: Secondary | ICD-10-CM | POA: Diagnosis not present

## 2022-10-08 ENCOUNTER — Ambulatory Visit (HOSPITAL_BASED_OUTPATIENT_CLINIC_OR_DEPARTMENT_OTHER)
Admission: RE | Admit: 2022-10-08 | Discharge: 2022-10-08 | Disposition: A | Discharge status: Home / Self Care | Payer: Medicare Other | Source: Ambulatory Visit | Attending: Internal Medicine | Admitting: Internal Medicine

## 2022-10-08 DIAGNOSIS — F17210 Nicotine dependence, cigarettes, uncomplicated: Secondary | ICD-10-CM | POA: Diagnosis not present

## 2022-10-08 DIAGNOSIS — K7689 Other specified diseases of liver: Secondary | ICD-10-CM | POA: Insufficient documentation

## 2022-10-08 DIAGNOSIS — I700 Atherosclerosis of aorta: Secondary | ICD-10-CM | POA: Diagnosis not present

## 2022-10-08 DIAGNOSIS — E041 Nontoxic single thyroid nodule: Secondary | ICD-10-CM | POA: Diagnosis not present

## 2022-10-08 DIAGNOSIS — I7121 Aneurysm of the ascending aorta, without rupture: Secondary | ICD-10-CM | POA: Diagnosis not present

## 2022-10-12 ENCOUNTER — Encounter: Payer: Self-pay | Admitting: Cardiology

## 2022-10-14 DIAGNOSIS — S39012D Strain of muscle, fascia and tendon of lower back, subsequent encounter: Secondary | ICD-10-CM | POA: Diagnosis not present

## 2022-10-21 DIAGNOSIS — M545 Low back pain, unspecified: Secondary | ICD-10-CM | POA: Diagnosis not present

## 2022-10-28 NOTE — Telephone Encounter (Signed)
Spoke with patient and he was given detailed instructions about his STRESS TEST on 10/30/22 at 8:00.I also reminded him about his ECHO at 8:15.

## 2022-10-30 ENCOUNTER — Ambulatory Visit (HOSPITAL_BASED_OUTPATIENT_CLINIC_OR_DEPARTMENT_OTHER): Payer: Medicare Other

## 2022-10-30 ENCOUNTER — Ambulatory Visit (HOSPITAL_COMMUNITY): Payer: Medicare Other | Attending: Cardiology

## 2022-10-30 DIAGNOSIS — I2510 Atherosclerotic heart disease of native coronary artery without angina pectoris: Secondary | ICD-10-CM

## 2022-10-30 DIAGNOSIS — I2584 Coronary atherosclerosis due to calcified coronary lesion: Secondary | ICD-10-CM | POA: Insufficient documentation

## 2022-10-30 DIAGNOSIS — R9431 Abnormal electrocardiogram [ECG] [EKG]: Secondary | ICD-10-CM

## 2022-10-30 DIAGNOSIS — R001 Bradycardia, unspecified: Secondary | ICD-10-CM

## 2022-10-30 LAB — ECHOCARDIOGRAM COMPLETE
Area-P 1/2: 3.69 cm2
Height: 72 in
P 1/2 time: 786 msec
S' Lateral: 3.2 cm
Weight: 3104 oz

## 2022-10-30 LAB — MYOCARDIAL PERFUSION IMAGING
Angina Index: 0
Duke Treadmill Score: 7
Estimated workload: 8.5
Exercise duration (min): 7 min
Exercise duration (sec): 0 s
LV dias vol: 127 mL (ref 62–150)
LV sys vol: 65 mL
MPHR: 144 {beats}/min
Nuc Stress EF: 49 %
Peak HR: 139 {beats}/min
Percent HR: 96 %
Rest HR: 53 {beats}/min
Rest Nuclear Isotope Dose: 10.6 mCi
SDS: 1
SRS: 0
SSS: 1
ST Depression (mm): 0 mm
Stress Nuclear Isotope Dose: 31.6 mCi
TID: 1.04

## 2022-10-30 MED ORDER — TECHNETIUM TC 99M TETROFOSMIN IV KIT
31.6000 | PACK | Freq: Once | INTRAVENOUS | Status: AC | PRN
  Administered 2022-10-30: 31.6 via INTRAVENOUS

## 2022-10-30 MED ORDER — TECHNETIUM TC 99M TETROFOSMIN IV KIT
10.6000 | PACK | Freq: Once | INTRAVENOUS | Status: AC | PRN
  Administered 2022-10-30: 10.6 via INTRAVENOUS

## 2022-11-03 ENCOUNTER — Ambulatory Visit: Payer: Medicare Other | Admitting: Cardiology

## 2022-11-03 DIAGNOSIS — L905 Scar conditions and fibrosis of skin: Secondary | ICD-10-CM | POA: Diagnosis not present

## 2022-11-03 DIAGNOSIS — L26 Exfoliative dermatitis: Secondary | ICD-10-CM | POA: Diagnosis not present

## 2022-11-04 ENCOUNTER — Encounter: Payer: Self-pay | Admitting: Cardiology

## 2022-11-04 ENCOUNTER — Telehealth: Payer: Self-pay | Admitting: *Deleted

## 2022-11-04 ENCOUNTER — Encounter: Payer: Self-pay | Admitting: *Deleted

## 2022-11-04 ENCOUNTER — Ambulatory Visit: Payer: Medicare Other | Attending: Cardiology

## 2022-11-04 DIAGNOSIS — R002 Palpitations: Secondary | ICD-10-CM

## 2022-11-04 NOTE — Progress Notes (Unsigned)
Enrolled patient for a 7 day Zio XT monitor to be mailed to patients home.  

## 2022-11-04 NOTE — Telephone Encounter (Signed)
7 day zio order placed and staff message sent to our monitor dept to enroll and mail this out to the pt.   Will send zio monitor instructions to the pts mychart account to review and refer to.

## 2022-11-04 NOTE — Telephone Encounter (Signed)
-----   Message from Cory Sprague, MD sent at 11/04/2022  2:50 PM EDT ----- Called and spoke to the patient about the test results. Had a brief run of Afib at peak exercise. Will place 7 day zio and monitor at home. Patient understands and is amenable to proceeding.

## 2022-11-07 DIAGNOSIS — R002 Palpitations: Secondary | ICD-10-CM | POA: Diagnosis not present

## 2022-11-10 ENCOUNTER — Ambulatory Visit: Payer: Medicare Other | Admitting: Cardiology

## 2022-11-26 DIAGNOSIS — R002 Palpitations: Secondary | ICD-10-CM | POA: Diagnosis not present

## 2023-01-01 DIAGNOSIS — L821 Other seborrheic keratosis: Secondary | ICD-10-CM | POA: Diagnosis not present

## 2023-01-01 DIAGNOSIS — L820 Inflamed seborrheic keratosis: Secondary | ICD-10-CM | POA: Diagnosis not present

## 2023-02-18 DIAGNOSIS — H353212 Exudative age-related macular degeneration, right eye, with inactive choroidal neovascularization: Secondary | ICD-10-CM | POA: Diagnosis not present

## 2023-02-18 DIAGNOSIS — H25813 Combined forms of age-related cataract, bilateral: Secondary | ICD-10-CM | POA: Diagnosis not present

## 2023-02-18 DIAGNOSIS — H353121 Nonexudative age-related macular degeneration, left eye, early dry stage: Secondary | ICD-10-CM | POA: Diagnosis not present

## 2023-02-18 DIAGNOSIS — E119 Type 2 diabetes mellitus without complications: Secondary | ICD-10-CM | POA: Diagnosis not present

## 2023-02-18 DIAGNOSIS — H43812 Vitreous degeneration, left eye: Secondary | ICD-10-CM | POA: Diagnosis not present

## 2023-03-12 DIAGNOSIS — N200 Calculus of kidney: Secondary | ICD-10-CM | POA: Diagnosis not present

## 2023-03-20 ENCOUNTER — Encounter (HOSPITAL_BASED_OUTPATIENT_CLINIC_OR_DEPARTMENT_OTHER): Payer: Self-pay | Admitting: Emergency Medicine

## 2023-03-20 ENCOUNTER — Emergency Department (HOSPITAL_BASED_OUTPATIENT_CLINIC_OR_DEPARTMENT_OTHER): Payer: Medicare Other

## 2023-03-20 ENCOUNTER — Emergency Department (HOSPITAL_BASED_OUTPATIENT_CLINIC_OR_DEPARTMENT_OTHER)
Admission: EM | Admit: 2023-03-20 | Discharge: 2023-03-20 | Disposition: A | Discharge status: Home / Self Care | Payer: Medicare Other | Attending: Emergency Medicine | Admitting: Emergency Medicine

## 2023-03-20 ENCOUNTER — Other Ambulatory Visit: Payer: Self-pay

## 2023-03-20 DIAGNOSIS — Z7982 Long term (current) use of aspirin: Secondary | ICD-10-CM | POA: Diagnosis not present

## 2023-03-20 DIAGNOSIS — R109 Unspecified abdominal pain: Secondary | ICD-10-CM | POA: Diagnosis present

## 2023-03-20 DIAGNOSIS — N132 Hydronephrosis with renal and ureteral calculous obstruction: Secondary | ICD-10-CM | POA: Insufficient documentation

## 2023-03-20 DIAGNOSIS — Z9049 Acquired absence of other specified parts of digestive tract: Secondary | ICD-10-CM | POA: Diagnosis not present

## 2023-03-20 DIAGNOSIS — Z7984 Long term (current) use of oral hypoglycemic drugs: Secondary | ICD-10-CM | POA: Diagnosis not present

## 2023-03-20 DIAGNOSIS — K769 Liver disease, unspecified: Secondary | ICD-10-CM | POA: Diagnosis not present

## 2023-03-20 DIAGNOSIS — N200 Calculus of kidney: Secondary | ICD-10-CM | POA: Diagnosis not present

## 2023-03-20 DIAGNOSIS — E1165 Type 2 diabetes mellitus with hyperglycemia: Secondary | ICD-10-CM | POA: Insufficient documentation

## 2023-03-20 DIAGNOSIS — N23 Unspecified renal colic: Secondary | ICD-10-CM

## 2023-03-20 LAB — COMPREHENSIVE METABOLIC PANEL
ALT: 17 U/L (ref 0–44)
AST: 18 U/L (ref 15–41)
Albumin: 4.1 g/dL (ref 3.5–5.0)
Alkaline Phosphatase: 53 U/L (ref 38–126)
Anion gap: 9 (ref 5–15)
BUN: 14 mg/dL (ref 8–23)
CO2: 28 mmol/L (ref 22–32)
Calcium: 9.3 mg/dL (ref 8.9–10.3)
Chloride: 103 mmol/L (ref 98–111)
Creatinine, Ser: 0.97 mg/dL (ref 0.61–1.24)
GFR, Estimated: >60 mL/min (ref >60)
Glucose, Bld: 190 mg/dL — ABNORMAL HIGH (ref 70–99)
Potassium: 4.3 mmol/L (ref 3.5–5.1)
Sodium: 140 mmol/L (ref 135–145)
Total Bilirubin: 0.8 mg/dL (ref 0.3–1.2)
Total Protein: 6.4 g/dL — ABNORMAL LOW (ref 6.5–8.1)

## 2023-03-20 LAB — URINALYSIS, W/ REFLEX TO CULTURE (INFECTION SUSPECTED)
Bacteria, UA: NONE SEEN
Bilirubin Urine: NEGATIVE
Glucose, UA: NEGATIVE mg/dL
Hgb urine dipstick: NEGATIVE
Ketones, ur: 15 mg/dL — AB
Leukocytes,Ua: NEGATIVE
Nitrite: NEGATIVE
Specific Gravity, Urine: 1.028 (ref 1.005–1.030)
pH: 6 (ref 5.0–8.0)

## 2023-03-20 LAB — CBC WITH DIFFERENTIAL/PLATELET
Abs Immature Granulocytes: 0.02 10*3/uL (ref 0.00–0.07)
Basophils Absolute: 0 10*3/uL (ref 0.0–0.1)
Basophils Relative: 0 %
Eosinophils Absolute: 0 10*3/uL (ref 0.0–0.5)
Eosinophils Relative: 0 %
HCT: 42.3 % (ref 39.0–52.0)
Hemoglobin: 14.5 g/dL (ref 13.0–17.0)
Immature Granulocytes: 0 %
Lymphocytes Relative: 11 %
Lymphs Abs: 1 10*3/uL (ref 0.7–4.0)
MCH: 30.5 pg (ref 26.0–34.0)
MCHC: 34.3 g/dL (ref 30.0–36.0)
MCV: 88.9 fL (ref 80.0–100.0)
Monocytes Absolute: 0.6 10*3/uL (ref 0.1–1.0)
Monocytes Relative: 7 %
Neutro Abs: 7.6 10*3/uL (ref 1.7–7.7)
Neutrophils Relative %: 82 %
Platelets: 172 10*3/uL (ref 150–400)
RBC: 4.76 MIL/uL (ref 4.22–5.81)
RDW: 13.4 % (ref 11.5–15.5)
WBC: 9.3 10*3/uL (ref 4.0–10.5)
nRBC: 0 % (ref 0.0–0.2)

## 2023-03-20 LAB — LIPASE, BLOOD: Lipase: 54 U/L — ABNORMAL HIGH (ref 11–51)

## 2023-03-20 MED ORDER — SODIUM CHLORIDE 0.9 % IV SOLN: INTRAVENOUS | Status: DC

## 2023-03-20 MED ORDER — TAMSULOSIN HCL 0.4 MG PO CAPS
0.4000 mg | ORAL_CAPSULE | Freq: Once | ORAL | Status: DC
  Filled 2023-03-20: qty 1

## 2023-03-20 MED ORDER — ONDANSETRON HCL 4 MG/2ML IJ SOLN
4.0000 mg | Freq: Once | INTRAMUSCULAR | Status: AC
  Administered 2023-03-20: 4 mg via INTRAVENOUS
  Filled 2023-03-20: qty 2

## 2023-03-20 MED ORDER — OXYCODONE-ACETAMINOPHEN 5-325 MG PO TABS: 1.0000 | ORAL_TABLET | Freq: Four times a day (QID) | ORAL | 0 refills | Status: DC | PRN

## 2023-03-20 MED ORDER — HYDROMORPHONE HCL 1 MG/ML IJ SOLN
0.5000 mg | Freq: Once | INTRAMUSCULAR | Status: AC
  Administered 2023-03-20: 0.5 mg via INTRAVENOUS
  Filled 2023-03-20: qty 1

## 2023-03-20 MED ORDER — KETOROLAC TROMETHAMINE 15 MG/ML IJ SOLN
7.5000 mg | Freq: Once | INTRAMUSCULAR | Status: AC
  Administered 2023-03-20: 7.5 mg via INTRAVENOUS
  Filled 2023-03-20: qty 1

## 2023-03-20 MED ORDER — ONDANSETRON HCL 4 MG PO TABS: 4.0000 mg | ORAL_TABLET | Freq: Four times a day (QID) | ORAL | 0 refills | Status: DC

## 2023-03-20 NOTE — ED Provider Notes (Signed)
[place] EMERGENCY DEPARTMENT AT [facility] Provider Note  CSN: [id number] Arrival date & time: 03/20/23 0518  Chief Complaint(s) Flank Pain  HPI Cory Walls is a 77 y.o. male with a past medical history listed below who presents to the emergency department with sudden onset left flank pain described as deep aching.  Pain is fluctuating in nature.  Associated with episode of nausea and nonbloody nonbilious emesis.  Pain is severe.  Similar to prior renal stone pain.  No other physical complaints.  The history is provided by the patient.    Past Medical History Past Medical History:  Diagnosis Date   Arthritis    hands    Colon polyps    Diabetes mellitus without complication (HCC)    type II   History of blood transfusion    age 77   History of kidney stones    Hyperlipidemia    Nausea and vomiting - with colonoscopy preps 05/21/2015   Patient Active Problem List   Diagnosis Date Noted   Left massive rotator cuff tear 06/02/2021   Posterior vitreous detachment of left eye 01/30/2016   Intractable nausea and vomiting 05/22/2015   Nausea and vomiting - with colonoscopy preps 05/21/2015   History of colonic polyps 05/25/2012   Combined forms of age-related cataract of both eyes 08/13/2011   Early stage nonexudative age-related macular degeneration of left eye 08/13/2011   Home Medication(s) Prior to Admission medications   Medication Sig Start Date End Date Taking? Authorizing Provider  aspirin 81 MG tablet Take 81 mg by mouth daily.    [provider]  cholecalciferol (VITAMIN D3) 25 MCG (1000 UNIT) tablet Take 2,000 Units by mouth daily.    [provider]  fish oil-omega-3 fatty acids 1000 MG capsule Take 1 g by mouth 2 (two) times daily.    [provider]  Glucosamine-Chondroit-Vit C-Mn (GLUCOSAMINE CHONDR 1500 COMPLX) CAPS Take 2 capsules by mouth daily.    [provider]  losartan (COZAAR) 25 MG tablet Take 25 mg by  mouth every evening. 03/17/21   [provider]  Magnesium 250 MG TABS Take 250 mg by mouth daily.    [provider]  metFORMIN (GLUCOPHAGE-XR) 500 MG 24 hr tablet Take 500 mg by mouth every evening. 06/15/20   [provider]  Multiple Vitamins-Minerals (OCUVITE ADULT 50+) CAPS Take 1 capsule by mouth daily.    [provider]  MULTIPLE VITAMINS-MINERALS PO Take 1 tablet by mouth daily.    [provider]  OVER THE COUNTER MEDICATION Apply 1 application topically daily as needed (arthritis pain). Australian Dream Cream    [provider]  pravastatin (PRAVACHOL) 40 MG tablet Take 40 mg by mouth every evening.    [provider]  vitamin C (ASCORBIC ACID) 500 MG tablet Take 1,000 mg by mouth daily.    [provider]  vitamin E 400 UNIT capsule Take 400 Units by mouth daily.    [provider]  Allergies Moviprep [peg-kcl-nacl-nasulf-na asc-c] and Penicillins  Review of Systems Review of Systems As noted in HPI  Physical Exam Vital Signs  I have reviewed the triage vital signs BP 124/78   Pulse 80   Temp 98.9 F (37.2 C) (Oral)   Resp 20   Ht 6' (1.829 m)   Wt 88 kg   SpO2 97%   BMI 26.31 kg/m   Physical Exam Vitals reviewed.  Constitutional:      General: He is not in acute distress.    Appearance: He is well-developed. He is not diaphoretic.  HENT:     Head: Normocephalic and atraumatic.     Right Ear: External ear normal.     Left Ear: External ear normal.     Nose: Nose normal.     Mouth/Throat:     Mouth: Mucous membranes are moist.  Eyes:     General: No scleral icterus.    Conjunctiva/sclera: Conjunctivae normal.  Neck:     Trachea: Phonation normal.  Cardiovascular:     Rate and Rhythm: Normal rate and regular rhythm.  Pulmonary:     Effort: Pulmonary  effort is normal. No respiratory distress.     Breath sounds: No stridor.  Abdominal:     General: There is no distension.     Tenderness: There is abdominal tenderness in the left lower quadrant.    Musculoskeletal:        General: Normal range of motion.     Cervical back: Normal range of motion.  Neurological:     Mental Status: He is alert and oriented to person, place, and time.  Psychiatric:        Behavior: Behavior normal.     ED Results and Treatments Labs (all labs ordered are listed, but only abnormal results are displayed) Labs Reviewed  COMPREHENSIVE METABOLIC PANEL - Abnormal; Notable for the following components:      Result Value   Glucose, Bld 190 (*)    Total Protein 6.4 (*)    All other components within normal limits  LIPASE, BLOOD - Abnormal; Notable for the following components:   Lipase 54 (*)    All other components within normal limits  CBC WITH DIFFERENTIAL/PLATELET  URINALYSIS, W/ REFLEX TO CULTURE (INFECTION SUSPECTED)                                                                                                                         EKG  EKG Interpretation Date/Time:    Ventricular Rate:    PR Interval:    QRS Duration:    QT Interval:    QTC Calculation:   R Axis:      Text Interpretation:         Radiology CT Renal Stone Study  Result Date: 03/20/2023 CLINICAL DATA:  77 year old male with history of abdominal and flank pain. Suspected kidney stone. EXAM: CT ABDOMEN AND PELVIS WITHOUT CONTRAST TECHNIQUE: Multidetector CT imaging of the abdomen and pelvis was performed  following the standard protocol without IV contrast. RADIATION DOSE REDUCTION: This exam was performed according to the departmental dose-optimization program which includes automated exposure control, adjustment of the mA and/or kV according to patient size and/or use of iterative reconstruction technique. COMPARISON:  CT of the abdomen and pelvis 04/25/2021. FINDINGS:  Lower chest: Unremarkable. Hepatobiliary: Low-attenuation lesions in the liver again noted, unchanged compared to the prior study, incompletely characterized on today's noncontrast CT examination, but statistically likely cysts (no imaging follow-up recommended), measuring up to 1.8 cm in the periphery of segment 7. No other definite suspicious appearing hepatic lesions are confidently identified on today's noncontrast CT examination. Status post cholecystectomy. Pancreas: No definite pancreatic mass or peripancreatic fluid collections or inflammatory changes are noted on today's noncontrast CT examination. Spleen: Unremarkable. Adrenals/Urinary Tract: In the proximal third of the left ureter (axial image 36 of series 2) there is a 4 mm obstructing calculus with mild proximal left hydroureteronephrosis. Additional nonobstructive calculi are noted within the left renal collecting system measuring up to 2 mm in the lower pole. No calculi are identified within the right renal collecting system, along the course of the right ureter or within the lumen of the urinary bladder. No right hydroureteronephrosis. No definite suspicious appearing renal lesions. Bilateral adrenal glands are normal in appearance. Urinary bladder is unremarkable in appearance. Stomach/Bowel: Unenhanced appearance of the stomach is unremarkable. No pathologic dilatation of small bowel or colon. The appendix is not confidently identified and may be surgically absent. Regardless, there are no inflammatory changes noted adjacent to the cecum to suggest the presence of an acute appendicitis at this time. Vascular/Lymphatic: No atherosclerotic calcifications are noted in the abdominal aorta or pelvic vasculature. No lymphadenopathy noted in the abdomen or pelvis. Reproductive: Prostate gland and seminal vesicles are unremarkable in appearance. Other: No significant volume of ascites.  No pneumoperitoneum. Musculoskeletal: Bilateral pars defects are  noted at L5 with 10 mm of anterolisthesis of L5 upon S1. There are no aggressive appearing lytic or blastic lesions noted in the visualized portions of the skeleton. IMPRESSION: 1. In addition to small nonobstructive calculi in the left renal collecting system, there is a 4 mm obstructive calculus in the proximal third of the left ureter with mild proximal left hydroureteronephrosis. 2. Grade 2 spondylolisthesis of L5 upon S1. 3. Additional incidental findings, as above. Electronically Signed   By: Trudie Reed M.D.   On: 03/20/2023 07:10    Medications Ordered in ED Medications  0.9 %  sodium chloride infusion ( Intravenous New Bag/Given 03/20/23 0540)  HYDROmorphone (DILAUDID) injection 0.5 mg (0.5 mg Intravenous Given 03/20/23 0546)  ondansetron (ZOFRAN) injection 4 mg (4 mg Intravenous Given 03/20/23 0543)  HYDROmorphone (DILAUDID) injection 0.5 mg (0.5 mg Intravenous Given 03/20/23 0648)  ketorolac (TORADOL) 15 MG/ML injection 7.5 mg (7.5 mg Intravenous Given 03/20/23 0646)   Procedures Procedures  (including critical care time) Medical Decision Making / ED Course   Medical Decision Making Amount and/or Complexity of Data Reviewed Labs: ordered. Decision-making details documented in ED Course. Radiology: ordered and independent interpretation performed. Decision-making details documented in ED Course.  Risk Prescription drug management.    Workup for differential diagnoses listed below.  Presentation and workup favors renal stone confirmed by CT scan.  Patient provided with IV pain medicine.  Currently pending UA.  Rest of the workup was reassuring without leukocytosis or anemia.  No electrolyte derangements.  Hyperglycemia without DKA.  No renal sufficiency.  No evidence of bili obstruction or pancreatitis.  No other intra-abdominal inflammatory/infectious process.  No concern for acute aortic process.  Patient care turned over to oncoming provider. Patient case and results  discussed in detail; please see their note for further ED managment.       Final Clinical Impression(s) / ED Diagnoses Final diagnoses:  Renal colic on left side    This chart was dictated using voice recognition software.  Despite best efforts to proofread,  errors can occur which can change the documentation meaning.    Nira Conn, MD 03/20/23 [phone]

## 2023-03-20 NOTE — ED Triage Notes (Signed)
  Patient comes in with L flank pain that started about an hour ago.  Patient has hx of kidney stones and states it feels similar.  States he has had 2 episodes of emesis.  Patient unable to get comfortable and tearful during triage.  Pain 10/10, aching.

## 2023-03-20 NOTE — ED Provider Notes (Signed)
77 year old male signed out to me pending CT scan and UA.  Suspected stone  CT scan with 4 mm obstructing stone.  Patient and feeling improved after Dilaudid and Toradol.  No nausea or vomiting. Physical Exam  BP 124/78   Pulse 80   Temp 98.9 F (37.2 C) (Oral)   Resp 20   Ht 6' (1.829 m)   Wt 88 kg   SpO2 97%   BMI 26.31 kg/m   Physical Exam Constitutional:      General: He is not in acute distress.    Appearance: He is not toxic-appearing.  HENT:     Head: Normocephalic.     Nose: Nose normal.     Mouth/Throat:     Mouth: Mucous membranes are moist.  Eyes:     Conjunctiva/sclera: Conjunctivae normal.  Pulmonary:     Effort: Pulmonary effort is normal.  Abdominal:     General: Abdomen is flat.     Tenderness: There is abdominal tenderness (mild left flank/abd). There is no guarding or rebound.  Skin:    General: Skin is warm.     Capillary Refill: Capillary refill takes less than 2 seconds.  Neurological:     Mental Status: He is alert and oriented to person, place, and time.  Psychiatric:        Mood and Affect: Mood normal.        Behavior: Behavior normal.     Procedures  Procedures  ED Course / MDM    Medical Decision Making Well-appearing 77 year old male with left flank pain/abdominal pain.  Signed out to me by overnight provider.  See their note for full HPI.  Patient feeling improved after pain medications.  CT scan with 4 mm stone.  Per patient has a history of same.  Pain is seemingly under control.  Tolerating p.o. in the emergency department.  UA negative for UTI.  Will discharge with pain medications.  Patient to follow-up with his urologist.  Return precautions given.  Amount and/or Complexity of Data Reviewed Labs: ordered. Radiology: ordered.  Risk Prescription drug management.         Coral Spikes, DO 03/20/23 [phone]

## 2023-03-20 NOTE — Discharge Instructions (Addendum)
Please take over-the-counter Tylenol alternating with Motrin for pain.  You may use Percocet for breakthrough pain.  You do not have a urinary tract infection, as such you do not need antibiotics.  He will need to follow-up with your primary doctor and your urologist.  Return immediately if develop fevers, chills, severe pain, unable to tolerate food or drink due to nausea and vomiting you stop urinating or you develop any new or worsening symptoms that are concerning to you.

## 2023-03-22 ENCOUNTER — Encounter (HOSPITAL_BASED_OUTPATIENT_CLINIC_OR_DEPARTMENT_OTHER): Payer: Self-pay | Admitting: Urology

## 2023-03-22 ENCOUNTER — Other Ambulatory Visit: Payer: Self-pay | Admitting: Urology

## 2023-03-22 ENCOUNTER — Other Ambulatory Visit: Payer: Self-pay

## 2023-03-22 DIAGNOSIS — N202 Calculus of kidney with calculus of ureter: Secondary | ICD-10-CM | POA: Diagnosis not present

## 2023-03-22 NOTE — Progress Notes (Addendum)
Spoke w/ via phone for pre-op interview---pt Lab needs dos----  none , surgery orders need 2nd sign      Lab results------cbc, cmet 03-20-2023 epic, EKG 10-01-2022 epic COVID test -----patient states asymptomatic no test needed Arrive at -------1115 03-24-2023 NPO after MN NO Solid Food.  Clear liquids from MN until---1015 Med rec completed Medications to take morning of surgery -----oxycodone prn Diabetic medication -----n/a Patient instructed no nail polish to be worn day of surgery Patient instructed to bring photo id and insurance card day of surgery Patient aware to have Driver (ride ) / caregiver    for 24 hours after surgery - daughter Karen Kitchens or daughter Shanda Bumps  Patient Special Instructions -----none Pre-Op special Instructions -----pt stopped 81 mg asa, last dose 03-22-2023 Patient verbalized understanding of instructions that were given at this phone interview. Patient denies chest pain, sob, fever, cough at the interview.   [facility] cardiology dr Shari Prows 11-07-2022 epic Longterm monitor 11-26-2022 epic Stress test 10-30-2022 epic Echo 10-30-2022 epic

## 2023-03-24 ENCOUNTER — Ambulatory Visit (HOSPITAL_BASED_OUTPATIENT_CLINIC_OR_DEPARTMENT_OTHER): Admission: RE | Admit: 2023-03-24 | Payer: Medicare Other | Source: Home / Self Care | Admitting: Urology

## 2023-03-24 DIAGNOSIS — Z01818 Encounter for other preprocedural examination: Secondary | ICD-10-CM

## 2023-03-24 HISTORY — DX: Palpitations: R00.2

## 2023-03-24 SURGERY — CYSTOSCOPY/URETEROSCOPY/HOLMIUM LASER/STENT PLACEMENT
Anesthesia: General | Laterality: Left

## 2023-04-01 DIAGNOSIS — R300 Dysuria: Secondary | ICD-10-CM | POA: Diagnosis not present

## 2023-04-01 DIAGNOSIS — R058 Other specified cough: Secondary | ICD-10-CM | POA: Diagnosis not present

## 2023-04-01 DIAGNOSIS — R630 Anorexia: Secondary | ICD-10-CM | POA: Diagnosis not present

## 2023-04-01 DIAGNOSIS — R5381 Other malaise: Secondary | ICD-10-CM | POA: Diagnosis not present

## 2023-04-27 DIAGNOSIS — I2510 Atherosclerotic heart disease of native coronary artery without angina pectoris: Secondary | ICD-10-CM | POA: Diagnosis not present

## 2023-04-27 DIAGNOSIS — Z1212 Encounter for screening for malignant neoplasm of rectum: Secondary | ICD-10-CM | POA: Diagnosis not present

## 2023-04-27 DIAGNOSIS — E785 Hyperlipidemia, unspecified: Secondary | ICD-10-CM | POA: Diagnosis not present

## 2023-04-27 DIAGNOSIS — E119 Type 2 diabetes mellitus without complications: Secondary | ICD-10-CM | POA: Diagnosis not present

## 2023-04-27 DIAGNOSIS — R7989 Other specified abnormal findings of blood chemistry: Secondary | ICD-10-CM | POA: Diagnosis not present

## 2023-04-27 DIAGNOSIS — N401 Enlarged prostate with lower urinary tract symptoms: Secondary | ICD-10-CM | POA: Diagnosis not present

## 2023-04-27 DIAGNOSIS — E782 Mixed hyperlipidemia: Secondary | ICD-10-CM | POA: Diagnosis not present

## 2023-04-27 DIAGNOSIS — R82998 Other abnormal findings in urine: Secondary | ICD-10-CM | POA: Diagnosis not present

## 2023-05-03 DIAGNOSIS — Z1212 Encounter for screening for malignant neoplasm of rectum: Secondary | ICD-10-CM | POA: Diagnosis not present

## 2023-05-03 DIAGNOSIS — E785 Hyperlipidemia, unspecified: Secondary | ICD-10-CM | POA: Diagnosis not present

## 2023-05-04 DIAGNOSIS — I2510 Atherosclerotic heart disease of native coronary artery without angina pectoris: Secondary | ICD-10-CM | POA: Diagnosis not present

## 2023-05-04 DIAGNOSIS — F17210 Nicotine dependence, cigarettes, uncomplicated: Secondary | ICD-10-CM | POA: Diagnosis not present

## 2023-05-04 DIAGNOSIS — I700 Atherosclerosis of aorta: Secondary | ICD-10-CM | POA: Diagnosis not present

## 2023-05-04 DIAGNOSIS — E782 Mixed hyperlipidemia: Secondary | ICD-10-CM | POA: Diagnosis not present

## 2023-05-04 DIAGNOSIS — R634 Abnormal weight loss: Secondary | ICD-10-CM | POA: Diagnosis not present

## 2023-05-04 DIAGNOSIS — E119 Type 2 diabetes mellitus without complications: Secondary | ICD-10-CM | POA: Diagnosis not present

## 2023-05-04 DIAGNOSIS — Z0000 Encounter for general adult medical examination without abnormal findings: Secondary | ICD-10-CM | POA: Diagnosis not present

## 2023-05-04 DIAGNOSIS — Z23 Encounter for immunization: Secondary | ICD-10-CM | POA: Diagnosis not present

## 2023-05-04 DIAGNOSIS — M10079 Idiopathic gout, unspecified ankle and foot: Secondary | ICD-10-CM | POA: Diagnosis not present

## 2023-05-24 DIAGNOSIS — Z23 Encounter for immunization: Secondary | ICD-10-CM | POA: Diagnosis not present

## 2023-05-26 DIAGNOSIS — L578 Other skin changes due to chronic exposure to nonionizing radiation: Secondary | ICD-10-CM | POA: Diagnosis not present

## 2023-05-26 DIAGNOSIS — L821 Other seborrheic keratosis: Secondary | ICD-10-CM | POA: Diagnosis not present

## 2023-05-26 DIAGNOSIS — L814 Other melanin hyperpigmentation: Secondary | ICD-10-CM | POA: Diagnosis not present

## 2023-05-26 DIAGNOSIS — D225 Melanocytic nevi of trunk: Secondary | ICD-10-CM | POA: Diagnosis not present

## 2023-05-26 DIAGNOSIS — Z86018 Personal history of other benign neoplasm: Secondary | ICD-10-CM | POA: Diagnosis not present

## 2023-05-27 DIAGNOSIS — N202 Calculus of kidney with calculus of ureter: Secondary | ICD-10-CM | POA: Diagnosis not present

## 2023-06-17 ENCOUNTER — Telehealth: Payer: Self-pay | Admitting: Internal Medicine

## 2023-06-17 DIAGNOSIS — Z860100 Personal history of colon polyps, unspecified: Secondary | ICD-10-CM

## 2023-06-17 NOTE — Telephone Encounter (Signed)
 Hi Dr. Avram,    Patient called to schedule 06/2023 colonoscopy. Patient is now the age of 68. Would you like to see patient in office prior to scheduling colonoscopy? Patient states he is having no symptoms or complications. Please advise on scheduling.    Thank you.

## 2023-06-17 NOTE — Telephone Encounter (Signed)
 OK to schedule w/ pre-visit and direct colon   Please let me know when his pre-visit is as he has customized prep instructions  Encounter Diagnosis  Name Primary?   History of colonic polyps Yes

## 2023-06-18 NOTE — Telephone Encounter (Signed)
Called patient to schedule colonoscopy. Left voicemail.

## 2023-07-05 ENCOUNTER — Encounter: Payer: Self-pay | Admitting: Internal Medicine

## 2023-07-05 DIAGNOSIS — D485 Neoplasm of uncertain behavior of skin: Secondary | ICD-10-CM | POA: Diagnosis not present

## 2023-07-12 ENCOUNTER — Encounter: Payer: Self-pay | Admitting: Internal Medicine

## 2023-07-13 ENCOUNTER — Telehealth: Payer: Self-pay

## 2023-07-13 NOTE — Telephone Encounter (Signed)
Dr. Leone Payor,   I see this patient has a complicated hx when it comes to drinking his prep solutions. I do see last time where he was given specific instruction on how he should prep for his colonoscopy. Please advise on what you would like prep wise for his upcoming procedure with you on 08/20/23 at 7:00 AM.   Thank you, PV

## 2023-07-13 NOTE — Telephone Encounter (Signed)
He needs to do the same prep as outlined in 07/12/2020 letter please  Samples of 290 micrograms Linzess to be given, Reglan pills 10 mg Rx and then OTC Senokot   Linzess samples 290 mcg 1 each day on 3 days and 2 days before his colonoscopy.  Take each morning on an empty stomach.   For his preparation he starts clear liquids the day before colonoscopy   Reglan 10 mg tablet to be taken at around 3 PM the day before colonoscopy   At 4 PM take 12 Senokot tablets as written in the pended prescription followed by large amounts of clear liquid at least 312 ounce drinks   Repeat Reglan at 9 PM and repeat Senokot at 10 PM

## 2023-07-14 DIAGNOSIS — D2361 Other benign neoplasm of skin of right upper limb, including shoulder: Secondary | ICD-10-CM | POA: Diagnosis not present

## 2023-07-14 DIAGNOSIS — D485 Neoplasm of uncertain behavior of skin: Secondary | ICD-10-CM | POA: Diagnosis not present

## 2023-07-14 DIAGNOSIS — Q859 Phakomatosis, unspecified: Secondary | ICD-10-CM | POA: Diagnosis not present

## 2023-07-14 NOTE — Telephone Encounter (Signed)
Hello Patti,   I am reaching out to see if you are able to assist me with this. Is there anyway you are able to provided me with the needed Lizness samples for this patient prior to his PV apt on 2/6.   Thank you,  Annie Sable

## 2023-07-14 NOTE — Telephone Encounter (Signed)
Sure, I will bring them down to you.

## 2023-07-22 ENCOUNTER — Ambulatory Visit: Payer: Medicare Other

## 2023-07-22 ENCOUNTER — Telehealth: Payer: Self-pay

## 2023-07-22 VITALS — Ht 72.0 in | Wt 180.0 lb

## 2023-07-22 DIAGNOSIS — Z860100 Personal history of colon polyps, unspecified: Secondary | ICD-10-CM

## 2023-07-22 MED ORDER — METOCLOPRAMIDE HCL 10 MG PO TABS
10.0000 mg | ORAL_TABLET | ORAL | 0 refills | Status: DC
Start: 2023-07-22 — End: 2024-02-09

## 2023-07-22 NOTE — Progress Notes (Signed)

## 2023-07-22 NOTE — Telephone Encounter (Signed)
 No answer, left message stating that I was calling for our pre visit appointment.

## 2023-07-26 DIAGNOSIS — E119 Type 2 diabetes mellitus without complications: Secondary | ICD-10-CM | POA: Diagnosis not present

## 2023-07-26 DIAGNOSIS — R634 Abnormal weight loss: Secondary | ICD-10-CM | POA: Diagnosis not present

## 2023-07-26 DIAGNOSIS — F329 Major depressive disorder, single episode, unspecified: Secondary | ICD-10-CM | POA: Diagnosis not present

## 2023-07-26 DIAGNOSIS — I700 Atherosclerosis of aorta: Secondary | ICD-10-CM | POA: Diagnosis not present

## 2023-08-19 NOTE — Progress Notes (Signed)
 [facility] History and Physical   Primary Care Physician:  Garlan Fillers, MD   Reason for Procedure:   Hx colon polyps  Plan:    colonoscopy     HPI: Cory Walls is a 78 y.o. male w/ following polyp hx  2006 - 6 adenomas (also had polyps 2001 approx, in CT) 05/25/2012 4 diminutive polyps - all tubular adenomas 05/21/2015 2 diminutive polyps ssp and tub adenoma - 07/30/2020 6 diminutive polyps all adenomas recall 2025  Past Medical History:  Diagnosis Date   Arthritis    hands    Colon polyps    Diabetes mellitus without complication (HCC)    type II   History of blood transfusion    age 35   History of kidney stones    Hyperlipidemia    Nausea and vomiting - with colonoscopy preps 05/21/2015   Palpitations     Past Surgical History:  Procedure Laterality Date   APPENDECTOMY     age 62 or 6   CHOLECYSTECTOMY     COLONOSCOPY     EXTRACORPOREAL SHOCK WAVE LITHOTRIPSY Right 07/27/2022   Procedure: EXTRACORPOREAL SHOCK WAVE LITHOTRIPSY (ESWL);  Surgeon: Jannifer Hick, MD;  [facility]: [facility];  Service: Urology;  Laterality: Right;   EXTRACORPOREAL SHOCK WAVE LITHOTRIPSY Left 08/07/2022   Procedure: EXTRACORPOREAL SHOCK WAVE LITHOTRIPSY (ESWL);  Surgeon: Belva Agee, MD;  Location: [facility];  Service: Urology;  Laterality: Left;   HERNIA REPAIR     age 66 or 7   Hirschsprung's resection  1961   REVERSE SHOULDER ARTHROPLASTY Left 06/17/2021   Procedure: REVERSE SHOULDER ARTHROPLASTY;  Surgeon: Teryl Lucy, MD;  Location: [facility];  Service: Orthopedics;  Laterality: Left;   surgery for adhesion  1961    Prior to Admission medications   Medication Sig Start Date End Date Taking? Authorizing Provider  allopurinol (ZYLOPRIM) 100 MG tablet Take 100 mg by mouth every evening.    [provider]  aspirin 81 MG tablet Take 81 mg by mouth daily.    [provider]  cholecalciferol (VITAMIN D3) 25  MCG (1000 UNIT) tablet Take 2,000 Units by mouth daily.    [provider]  fish oil-omega-3 fatty acids 1000 MG capsule Take 1 g by mouth 2 (two) times daily.    [provider]  Glucosamine-Chondroit-Vit C-Mn (GLUCOSAMINE CHONDR 1500 COMPLX) CAPS Take 2 capsules by mouth daily.    [provider]  losartan (COZAAR) 25 MG tablet Take 12.5 mg by mouth every evening. 03/17/21   [provider]  Magnesium 250 MG TABS Take 250 mg by mouth daily.    [provider]  metFORMIN (GLUCOPHAGE-XR) 500 MG 24 hr tablet Take 500 mg by mouth every evening. 06/15/20   [provider]  metoCLOPramide (REGLAN) 10 MG tablet Take 1 tablet (10 mg total) by mouth as directed. 07/22/23   Iva Boop, MD  Multiple Vitamins-Minerals [facility] ADULT 50+) CAPS Take 1 capsule by mouth daily.    [provider]  MULTIPLE VITAMINS-MINERALS PO Take 1 tablet by mouth daily.    [provider]  OVER THE COUNTER MEDICATION Apply 1 application topically daily as needed (arthritis pain). Australian Dream Cream    [provider]  pravastatin (PRAVACHOL) 40 MG tablet Take 40 mg by mouth every evening.    [provider]  SUPER B COMPLEX/C PO  05/30/23   [provider]  vitamin C (ASCORBIC ACID) 500 MG tablet  Take 1,000 mg by mouth daily.    [provider]  vitamin E 400 UNIT capsule Take 400 Units by mouth daily.    [provider]    Current Outpatient Medications  Medication Sig Dispense Refill   allopurinol (ZYLOPRIM) 100 MG tablet Take 100 mg by mouth every evening.     aspirin 81 MG tablet Take 81 mg by mouth daily.     cholecalciferol (VITAMIN D3) 25 MCG (1000 UNIT) tablet Take 2,000 Units by mouth daily.     fish oil-omega-3 fatty acids 1000 MG capsule Take 1 g by mouth 2 (two) times daily.     Glucosamine-Chondroit-Vit C-Mn (GLUCOSAMINE CHONDR 1500 COMPLX) CAPS Take 2 capsules by mouth daily.     losartan  (COZAAR) 25 MG tablet Take 12.5 mg by mouth every evening.     Magnesium 250 MG TABS Take 250 mg by mouth daily.     metFORMIN (GLUCOPHAGE-XR) 500 MG 24 hr tablet Take 500 mg by mouth every evening.     metoCLOPramide (REGLAN) 10 MG tablet Take 1 tablet (10 mg total) by mouth as directed. 2 tablet 0   Multiple Vitamins-Minerals (OCUVITE ADULT 50+) CAPS Take 1 capsule by mouth daily.     MULTIPLE VITAMINS-MINERALS PO Take 1 tablet by mouth daily.     OVER THE COUNTER MEDICATION Apply 1 application topically daily as needed (arthritis pain). Australian Dream Cream     pravastatin (PRAVACHOL) 40 MG tablet Take 40 mg by mouth every evening.     SUPER B COMPLEX/C PO      vitamin C (ASCORBIC ACID) 500 MG tablet Take 1,000 mg by mouth daily.     vitamin E 400 UNIT capsule Take 400 Units by mouth daily.     Current Facility-Administered Medications  Medication Dose Route Frequency Provider Last Rate Last Admin   0.9 %  sodium chloride infusion  500 mL Intravenous Once Iva Boop, MD        Allergies as of 08/20/2023 - Review Complete 08/20/2023  Allergen Reaction Noted   Penicillins Other (See Comments) 05/29/2009   Moviprep [peg-kcl-nacl-nasulf-na asc-c] Nausea And Vomiting 05/22/2015    Family History  Problem Relation Age of Onset   Heart disease Father    Colon cancer Neg Hx    Stomach cancer Neg Hx    Colon polyps Neg Hx    Diabetes Neg Hx    Kidney disease Neg Hx    Rectal cancer Neg Hx     Social History   Socioeconomic History   Marital status: Married    Spouse name: Not on file   Number of children: 2   Years of education: Not on file   Highest education level: Not on file  Occupational History   Occupation: Retired  Tobacco Use   Smoking status: Some Days    Current packs/day: 0.25    Types: Cigarettes, Cigars   Smokeless tobacco: Never   Tobacco comments:    smokes 2 to 3 cigarettes daily  Vaping Use   Vaping status: Never Used  Substance and Sexual  Activity   Alcohol use: Not Currently    Alcohol/week: 1.0 standard drink of alcohol    Types: 1 Glasses of wine per week    Comment: rare   Drug use: No   Sexual activity: Not on file  Other Topics Concern   Not on file  Social History Narrative   Not on file   Social Drivers of Health   Financial  Resource Strain: Not on file  Food Insecurity: Not on file  Transportation Needs: Not on file  Physical Activity: Not on file  Stress: Not on file  Social Connections: Not on file  Intimate Partner Violence: Not on file    Review of Systems:  All other review of systems negative except as mentioned in the HPI.  Physical Exam: Vital signs BP 104/75   Pulse 76   Temp (!) 97 F (36.1 C) (Skin)   Resp 10   Ht 6' (1.829 m)   Wt 180 lb (81.6 kg)   SpO2 97%   BMI 24.41 kg/m   General:   Alert,  Well-developed, well-nourished, pleasant and cooperative in NAD Lungs:  Clear throughout to auscultation.   Heart:  Regular rate and rhythm; no murmurs, clicks, rubs,  or gallops. Abdomen:  Soft, nontender and nondistended. Normal bowel sounds.   Neuro/Psych:  Alert and cooperative. Normal mood and affect. A and O x 3   @Aiyonna Lucado  Sena Slate, MD, [facility] [phone] (pager) 08/20/2023 8:05 AM@

## 2023-08-20 ENCOUNTER — Ambulatory Visit: Payer: Medicare Other | Admitting: Internal Medicine

## 2023-08-20 ENCOUNTER — Encounter: Payer: Self-pay | Admitting: Internal Medicine

## 2023-08-20 VITALS — BP 113/49 | HR 68 | Temp 97.0°F | Resp 12 | Ht 72.0 in | Wt 180.0 lb

## 2023-08-20 DIAGNOSIS — K5520 Angiodysplasia of colon without hemorrhage: Secondary | ICD-10-CM | POA: Insufficient documentation

## 2023-08-20 DIAGNOSIS — Z860100 Personal history of colon polyps, unspecified: Secondary | ICD-10-CM

## 2023-08-20 DIAGNOSIS — K635 Polyp of colon: Secondary | ICD-10-CM | POA: Diagnosis not present

## 2023-08-20 DIAGNOSIS — Z1211 Encounter for screening for malignant neoplasm of colon: Secondary | ICD-10-CM | POA: Diagnosis not present

## 2023-08-20 DIAGNOSIS — Z860101 Personal history of adenomatous and serrated colon polyps: Secondary | ICD-10-CM

## 2023-08-20 DIAGNOSIS — Z980 Intestinal bypass and anastomosis status: Secondary | ICD-10-CM

## 2023-08-20 DIAGNOSIS — D123 Benign neoplasm of transverse colon: Secondary | ICD-10-CM

## 2023-08-20 DIAGNOSIS — D120 Benign neoplasm of cecum: Secondary | ICD-10-CM | POA: Diagnosis not present

## 2023-08-20 DIAGNOSIS — E119 Type 2 diabetes mellitus without complications: Secondary | ICD-10-CM | POA: Diagnosis not present

## 2023-08-20 MED ORDER — SODIUM CHLORIDE 0.9 % IV SOLN: 500.0000 mL | Freq: Once | INTRAVENOUS | Status: DC

## 2023-08-20 NOTE — Op Note (Signed)
 [facility] Patient Name: Cory Walls Procedure Date: 08/20/2023 8:03 AM MRN: [id number] Endoscopist: Iva Boop , MD, [id number] Age: 78 Referring MD:  Date of Birth: [birth year] Gender: Male Account #: [id number] Procedure:                Colonoscopy Indications:              Surveillance: Personal history of adenomatous                            polyps on last colonoscopy 3 years ago, Last                            colonoscopy: February 2022 Medicines:                Monitored Anesthesia Care Procedure:                Pre-Anesthesia Assessment:                           - Prior to the procedure, a History and Physical                            was performed, and patient medications and                            allergies were reviewed. The patient's tolerance of                            previous anesthesia was also reviewed. The risks                            and benefits of the procedure and the sedation                            options and risks were discussed with the patient.                            All questions were answered, and informed consent                            was obtained. Prior Anticoagulants: The patient has                            taken no anticoagulant or antiplatelet agents. ASA                            Grade Assessment: III - A patient with severe                            systemic disease. After reviewing the risks and                            benefits, the patient was deemed in satisfactory  condition to undergo the procedure.                           After obtaining informed consent, the colonoscope                            was passed under direct vision. Throughout the                            procedure, the patient's blood pressure, pulse, and                            oxygen saturations were monitored continuously. The                            Olympus Scope [id number] was  introduced through the                            anus and advanced to the the cecum, identified by                            appendiceal orifice and ileocecal valve. The                            colonoscopy was performed without difficulty. The                            patient tolerated the procedure well. The quality                            of the bowel preparation was adequate. The                            ileocecal valve, appendiceal orifice, and rectum                            were photographed. The bowel preparation used was                            Linzess + Senokot via split dose instruction. he                            has vomiting with standard preps Scope In: 8:10:56 AM Scope Out: 8:31:40 AM Scope Withdrawal Time: 0 hours 18 minutes 49 seconds  Total Procedure Duration: 0 hours 20 minutes 44 seconds  Findings:                 The perianal and digital rectal examinations were                            normal. Pertinent negatives include normal prostate                            (size, shape, and consistency).  Six sessile polyps were found in the transverse                            colon, hepatic flexure and cecum. The polyps were                            diminutive in size. These polyps were removed with                            a cold snare. Resection and retrieval were                            complete. Verification of patient identification                            for the specimen was done. Estimated blood loss was                            minimal.                           There was evidence of a prior end-to-end                            colo-colonic anastomosis in the rectum. This was                            characterized by healthy appearing mucosa.                           A single small angiodysplastic lesion without                            bleeding was found in the cecum.                           The  exam was otherwise without abnormality on                            direct and retroflexion views. Complications:            No immediate complications. Estimated Blood Loss:     Estimated blood loss was minimal. Impression:               - Six diminutive polyps in the transverse colon, at                            the hepatic flexure and in the cecum, removed with                            a cold snare. Resected and retrieved.                           - End-to-end colo-colonic anastomosis,  characterized by healthy appearing mucosa.                            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 - this will make hemoccults + - would not                            do those                           - The examination was otherwise normal on direct                            and retroflexion views.                           - Personal history of colonic polyps. 2006 - 6                            adenomas (also had polyps 2001 approx, in [place])                           05/25/2012 4 diminutive polyps - all tubular                            adenomas                           05/21/2015 2 diminutive polyps ssp and tub adenoma -                           07/30/2020 6 diminutive polyps all adenomas Recommendation:           - Patient has a contact number available for                            emergencies. The signs and symptoms of potential                            delayed complications were discussed with the                            patient. Return to normal activities tomorrow.                            Written discharge instructions were provided to the                            patient.                           -  Resume previous diet.                           - Continue present medications.                           - Repeat colonoscopy is recommended  for                            surveillance. The colonoscopy date will be                            determined after pathology results from today's                            exam become available for review. he will be 42 -                            if vigorous will consider - also need to consider                            genetic testing given lifetime # adenomas Iva Boop, MD 08/20/2023 8:47:59 AM This report has been signed electronically.

## 2023-08-20 NOTE — Progress Notes (Signed)
 Vss nad trans to pacu

## 2023-08-20 NOTE — Progress Notes (Signed)
 Pt's states no medical or surgical changes since previsit or office visit.

## 2023-08-20 NOTE — Progress Notes (Signed)
 Called to room to assist during endoscopic procedure.  Patient ID and intended procedure confirmed with present staff. Received instructions for my participation in the procedure from the performing physician.

## 2023-08-20 NOTE — Patient Instructions (Addendum)
 I found and removed 6 small polyps. I will let you know pathology results and when to have another routine colonoscopy by mail and/or My Chart. I think we can revisit colonoscopy in 3 years and if still vigorous and desired - repeat one.  You also have something called an AVM or angiodysplasia - collection of blood vessels on surface. This can leak blood and make stool cards abnormal so skip those like we said.  I appreciate the opportunity to care for you. Iva Boop, MD, [facility]  Handouts provided on polyps and hemorrhoids.  Resume previous diet.  Continue previous medications.  Await pathology results.   YOU HAD AN ENDOSCOPIC PROCEDURE TODAY AT THE [facility]:   Refer to the procedure report that was given to you for any specific questions about what was found during the examination.  If the procedure report does not answer your questions, please call your gastroenterologist to clarify.  If you requested that your care partner not be given the details of your procedure findings, then the procedure report has been included in a sealed envelope for you to review at your convenience later.  YOU SHOULD EXPECT: Some feelings of bloating in the abdomen. Passage of more gas than usual.  Walking can help get rid of the air that was put into your GI tract during the procedure and reduce the bloating. If you had a lower endoscopy (such as a colonoscopy or flexible sigmoidoscopy) you may notice spotting of blood in your stool or on the toilet paper. If you underwent a bowel prep for your procedure, you may not have a normal bowel movement for a few days.  Please Note:  You might notice some irritation and congestion in your nose or some drainage.  This is from the oxygen used during your procedure.  There is no need for concern and it should clear up in a day or so.  SYMPTOMS TO REPORT IMMEDIATELY:  Following lower endoscopy (colonoscopy or flexible sigmoidoscopy):  Excessive amounts of  blood in the stool  Significant tenderness or worsening of abdominal pains  Swelling of the abdomen that is new, acute  Fever of 100F or higher  For urgent or emergent issues, a gastroenterologist can be reached at any hour by calling [phone]. Do not use MyChart messaging for urgent concerns.    DIET:  We do recommend a small meal at first, but then you may proceed to your regular diet.  Drink plenty of fluids but you should avoid alcoholic beverages for 24 hours.  ACTIVITY:  You should plan to take it easy for the rest of today and you should NOT DRIVE or use heavy machinery until tomorrow (because of the sedation medicines used during the test).    FOLLOW UP: Our staff will call the number listed on your records the next business day following your procedure.  We will call around 7:15- 8:00 am to check on you and address any questions or concerns that you may have regarding the information given to you following your procedure. If we do not reach you, we will leave a message.     If any biopsies were taken you will be contacted by phone or by letter within the next 1-3 weeks.  Please call us at [phone] if you have not heard about the biopsies in 3 weeks.    SIGNATURES/CONFIDENTIALITY: You and/or your care partner have signed paperwork which will be entered into your electronic medical record.  These signatures  attest to the fact that that the information above on your After Visit Summary has been reviewed and is understood.  Full responsibility of the confidentiality of this discharge information lies with you and/or your care-partner.

## 2023-08-23 ENCOUNTER — Telehealth: Payer: Self-pay

## 2023-08-23 NOTE — Telephone Encounter (Signed)
 Left message on answering machine.

## 2023-08-25 LAB — SURGICAL PATHOLOGY

## 2023-08-27 ENCOUNTER — Telehealth: Payer: Self-pay | Admitting: Internal Medicine

## 2023-08-27 NOTE — Telephone Encounter (Signed)
Patient retuning call. Please advise.

## 2023-08-27 NOTE — Telephone Encounter (Signed)
 See alternate note dated 08/27/23

## 2023-08-30 ENCOUNTER — Other Ambulatory Visit: Payer: Self-pay

## 2023-08-30 DIAGNOSIS — Z860100 Personal history of colon polyps, unspecified: Secondary | ICD-10-CM

## 2023-08-30 NOTE — Telephone Encounter (Signed)
 See results note dated 08/30/23

## 2023-08-30 NOTE — Telephone Encounter (Addendum)
 Patient returned call, please advise.

## 2023-09-08 ENCOUNTER — Other Ambulatory Visit: Payer: Self-pay | Admitting: Genetic Counselor

## 2023-09-08 ENCOUNTER — Encounter: Payer: Self-pay | Admitting: Genetic Counselor

## 2023-09-08 ENCOUNTER — Inpatient Hospital Stay

## 2023-09-08 ENCOUNTER — Inpatient Hospital Stay: Attending: Genetic Counselor | Admitting: Genetic Counselor

## 2023-09-08 DIAGNOSIS — Z860101 Personal history of adenomatous and serrated colon polyps: Secondary | ICD-10-CM | POA: Diagnosis not present

## 2023-09-08 DIAGNOSIS — Z860100 Personal history of colon polyps, unspecified: Secondary | ICD-10-CM

## 2023-09-08 LAB — GENETIC SCREENING ORDER

## 2023-09-08 NOTE — Progress Notes (Signed)
 REFERRING PROVIDER: Iva Boop, MD [address] [place],  [place] [address]  PRIMARY PROVIDER:  Garlan Fillers, MD  PRIMARY REASON FOR VISIT:  1. History of colon polyps      HISTORY OF PRESENT ILLNESS:  I connected with  Mr. Standage on 09/08/2023 at 2 pm EDT by telephone conference and verified that I am speaking with the correct person using two identifiers.   Patient location: Home Provider location: [facility]   Mr. Harbeson, a 78 y.o. male, was seen for a [facility] cancer genetics consultation at the request of Dr. Leone Payor due to a personal history of polyposis.  Mr. Prude presents to clinic today to discuss the possibility of a hereditary predisposition to cancer, genetic testing, and to further clarify his future cancer risks, as well as potential cancer risks for family members.   Mr. Check is a 78 y.o. male with no personal history of cancer.  The patient started having colonoscopy at 40 or 50.  We have access to his colonoscopies since age 17.  Since that time he has had 20 TAs, 1 Sessile serrated and 1 hyperplastic polyp.  CANCER HISTORY:  Oncology History   No history exists.   Past Medical History:  Diagnosis Date   Arthritis    hands    Colon polyps    Diabetes mellitus without complication (HCC)    type II   History of blood transfusion    age 55   History of colon polyps    History of kidney stones    Hyperlipidemia    Nausea and vomiting - with colonoscopy preps 05/21/2015   Palpitations     Past Surgical History:  Procedure Laterality Date   APPENDECTOMY     age 74 or 6   CHOLECYSTECTOMY     COLONOSCOPY     EXTRACORPOREAL SHOCK WAVE LITHOTRIPSY Right 07/27/2022   Procedure: EXTRACORPOREAL SHOCK WAVE LITHOTRIPSY (ESWL);  Surgeon: Jannifer Hick, MD;  Location: [facility];  Service: Urology;  Laterality: Right;   EXTRACORPOREAL SHOCK WAVE LITHOTRIPSY Left 08/07/2022   Procedure: EXTRACORPOREAL SHOCK WAVE LITHOTRIPSY  (ESWL);  Surgeon: Belva Agee, MD;  Location: [facility];  Service: Urology;  Laterality: Left;   HERNIA REPAIR     age 31 or 7   Hirschsprung's resection  1961   REVERSE SHOULDER ARTHROPLASTY Left 06/17/2021   Procedure: REVERSE SHOULDER ARTHROPLASTY;  Surgeon: Teryl Lucy, MD;  Location: [facility];  Service: Orthopedics;  Laterality: Left;   surgery for adhesion  1961    Social History   Socioeconomic History   Marital status: Widowed    Spouse name: Not on file   Number of children: 2   Years of education: Not on file   Highest education level: Not on file  Occupational History   Occupation: Retired  Tobacco Use   Smoking status: Some Days    Current packs/day: 0.25    Types: Cigarettes, Cigars   Smokeless tobacco: Never   Tobacco comments:    smokes 2 to 3 cigarettes daily  Vaping Use   Vaping status: Never Used  Substance and Sexual Activity   Alcohol use: Not Currently    Alcohol/week: 1.0 standard drink of alcohol    Types: 1 Glasses of wine per week    Comment: rare   Drug use: No   Sexual activity: Not on file  Other Topics Concern   Not on file  Social History Narrative   Not on  file   Social Drivers of Corporate investment banker Strain: Not on file  Food Insecurity: Not on file  Transportation Needs: Not on file  Physical Activity: Not on file  Stress: Not on file  Social Connections: Not on file     FAMILY HISTORY:  We obtained a detailed, 4-generation family history.  Significant diagnoses are listed below: Family History  Problem Relation Age of Onset   Heart disease Father    Colon cancer Neg Hx    Stomach cancer Neg Hx    Colon polyps Neg Hx    Diabetes Neg Hx    Kidney disease Neg Hx    Rectal cancer Neg Hx      The patient has two daughters who are cancer free.  He is an only child.  His parents are both deceased and never had colonoscopies.    The patient does not report a family history of cancer or colon  polyps.  Mr. Remer is unaware of previous family history of genetic testing for hereditary cancer risks.   GENETIC COUNSELING ASSESSMENT: Mr. Farino is a 78 y.o. male with a personal history of polyposis which is somewhat suggestive of a hereditary cancer syndrome and predisposition to cancer given the 22 colon polyps identified since he was 89. We, therefore, discussed and recommended the following at today's visit.   DISCUSSION: We discussed that, in general, most cancer is not inherited in families, but instead is sporadic or familial. Sporadic cancers occur by chance and typically happen at older ages (>50 years) as this type of cancer is caused by genetic changes acquired during an individual's lifetime. Some families have more cancers than would be expected by chance; however, the ages or types of cancer are not consistent with a known genetic mutation or known genetic mutations have been ruled out. This type of familial cancer is thought to be due to a combination of multiple genetic, environmental, hormonal, and lifestyle factors. While this combination of factors likely increases the risk of cancer, the exact source of this risk is not currently identifiable or testable.  We discussed that many people have colon polyps, but most people will have fewer than five in their lifetime.  When an individual develops 10 or more colon polyps it is called polyposis and it can increase the risk for colon cancer.  Most cases of polyposis are associated with APC mutations.  There are other genes that can be associated with hereditary polyposis cancer syndromes.  These include BMPR1A, MUTYH, NTHL1 and a few others.  We discussed that testing is beneficial for several reasons including knowing how to follow individuals after completing their treatment, identifying whether potential treatment options such as PARP inhibitors would be beneficial, and understand if other family members could be at risk for cancer  and allow them to undergo genetic testing.   We reviewed the characteristics, features and inheritance patterns of hereditary cancer syndromes. We also discussed genetic testing, including the appropriate family members to test, the process of testing, insurance coverage and turn-around-time for results. We discussed the implications of a negative, positive, carrier and/or variant of uncertain significant result. Mr. Schreier  was offered a common hereditary cancer panel (36+ genes) and an expanded pan-cancer panel (70+ genes). Mr. Kishi was informed of the benefits and limitations of each panel, including that expanded pan-cancer panels contain genes that do not have clear management guidelines at this point in time.  We also discussed that as the number of genes included  on a panel increases, the chances of variants of uncertain significance increases. Mr. Mckillop decided to pursue genetic testing for the CancerNext-Expanded+RNAinsight gene panel.   The CancerNext-Expanded gene panel offered by [facility] and includes sequencing, rearrangement, and RNA analysis for the following 76 genes: AIP, ALK, APC, ATM, AXIN2, BAP1, BARD1, BMPR1A, BRCA1, BRCA2, BRIP1, CDC73, CDH1, CDK4, CDKN1B, CDKN2A, CEBPA, CHEK2, CTNNA1, DDX41, DICER1, ETV6, FH, FLCN, GATA2, LZTR1, MAX, MBD4, MEN1, MET, MLH1, MSH2, MSH3, MSH6, MUTYH, NF1, NF2, NTHL1, PALB2, PHOX2B, PMS2, POT1, PRKAR1A, PTCH1, PTEN, RAD51C, RAD51D, RB1, RET, RUNX1, SDHA, SDHAF2, SDHB, SDHC, SDHD, SMAD4, SMARCA4, SMARCB1, SMARCE1, STK11, SUFU, TMEM127, TP53, TSC1, TSC2, VHL, and WT1 (sequencing and deletion/duplication); EGFR, HOXB13, KIT, MITF, PDGFRA, POLD1, and POLE (sequencing only); EPCAM and GREM1 (deletion/duplication only).    Based on Mr. Bayliss's personal history of polyposis, he meets medical criteria for genetic testing.  Despite that he meets criteria, he may still have an out of pocket cost. We discussed that if his out of pocket cost for  testing is over $100, the laboratory will call and confirm whether he wants to proceed with testing.  If the out of pocket cost of testing is less than $100 he will be billed by the genetic testing laboratory.   We discussed that some people do not want to undergo genetic testing due to fear of genetic discrimination.  The Genetic Information Nondiscrimination Act (GINA) was signed into federal law in 2008. GINA prohibits health insurers and most employers from discriminating against individuals based on genetic information (including the results of genetic tests and family history information). According to GINA, health insurance companies cannot consider genetic information to be a preexisting condition, nor can they use it to make decisions regarding coverage or rates. GINA also makes it illegal for most employers to use genetic information in making decisions about hiring, firing, promotion, or terms of employment. It is important to note that GINA does not offer protections for life insurance, disability insurance, or long-term care insurance. GINA does not apply to those in the [employer], those who work for companies with less than 15 employees, and new life insurance or long-term disability insurance policies.  Health status due to a cancer diagnosis is not protected under GINA. More information about GINA can be found by visiting [email or web address].  PLAN: After considering the risks, benefits, and limitations, Mr. Urey provided informed consent to pursue genetic testing and the blood sample was sent to [facility] for analysis of the CancerNext-Expanded+RNAinsight. Results should be available within approximately 2-3 weeks' time, at which point they will be disclosed by telephone to Mr. Virts, as will any additional recommendations warranted by these results. Mr. Aldape will receive a summary of his genetic counseling visit and a copy of his results once available. This information  will also be available in Epic.   Lastly, we encouraged Mr. Biondo to remain in contact with cancer genetics annually so that we can continuously update the family history and inform him of any changes in cancer genetics and testing that may be of benefit for this family.   Mr. Henrichs questions were answered to his satisfaction today. Our contact information was provided should additional questions or concerns arise. Thank you for the referral and allowing [place] to share in the care of your patient.   Aydn Ferrara P. Lowell Guitar, MS, CGC Licensed, Patent attorney [email or web address] phone: [phone]  60 minutes were spent on the date of the encounter in service to the patient including preparation, face-to-face  consultation, documentation and care coordination.  The patient was seen alone.  Drs. Meliton Rattan, and/or [place] were available for questions, if needed..    _______________________________________________________________________ For Office Staff:  Number of people involved in session: 1 Was an Intern/ student involved with case: no

## 2023-09-24 ENCOUNTER — Ambulatory Visit: Payer: Self-pay | Admitting: Genetic Counselor

## 2023-09-24 ENCOUNTER — Encounter: Payer: Self-pay | Admitting: Genetic Counselor

## 2023-09-24 ENCOUNTER — Telehealth: Payer: Self-pay | Admitting: Genetic Counselor

## 2023-09-24 DIAGNOSIS — Z1379 Encounter for other screening for genetic and chromosomal anomalies: Secondary | ICD-10-CM | POA: Insufficient documentation

## 2023-09-24 NOTE — Progress Notes (Signed)
 HPI:  [name] was previously seen in the [facility] due to a personal history of polyposis and concerns regarding a hereditary predisposition to cancer. Please refer to our prior cancer genetics clinic note for more information regarding our discussion, assessment and recommendations, at the time. [name] recent genetic test results were disclosed to him, as were recommendations warranted by these results. These results and recommendations are discussed in more detail below.  CANCER HISTORY:  Oncology History   No history exists.    FAMILY HISTORY:  We obtained a detailed, 4-generation family history.  Significant diagnoses are listed below: Family History  Problem Relation Age of Onset   Heart disease Father    Colon cancer Neg Hx    Stomach cancer Neg Hx    Colon polyps Neg Hx    Diabetes Neg Hx    Kidney disease Neg Hx    Rectal cancer Neg Hx        The patient has two daughters who are cancer free.  He is an only child.  His parents are both deceased and never had colonoscopies.     The patient does not report a family history of cancer or colon polyps.   [name] is unaware of previous family history of genetic testing for hereditary cancer risks  GENETIC TEST RESULTS: Genetic testing reported out on September 21, 2023 through the CancerNext-Expanded+RNAinsight cancer panel found no pathogenic mutations. The CancerNext-Expanded gene panel offered by [facility] and includes sequencing, rearrangement, and RNA analysis for the following 76 genes: AIP, ALK, APC, ATM, AXIN2, BAP1, BARD1, BMPR1A, BRCA1, BRCA2, BRIP1, CDC73, CDH1, CDK4, CDKN1B, CDKN2A, CEBPA, CHEK2, CTNNA1, DDX41, DICER1, ETV6, FH, FLCN, GATA2, LZTR1, MAX, MBD4, MEN1, MET, MLH1, MSH2, MSH3, MSH6, MUTYH, NF1, NF2, NTHL1, PALB2, PHOX2B, PMS2, POT1, PRKAR1A, PTCH1, PTEN, RAD51C, RAD51D, RB1, RET, RUNX1, SDHA, SDHAF2, SDHB, SDHC, SDHD, SMAD4, SMARCA4, SMARCB1, SMARCE1, STK11, SUFU,  TMEM127, TP53, TSC1, TSC2, VHL, and WT1 (sequencing and deletion/duplication); EGFR, HOXB13, KIT, MITF, PDGFRA, POLD1, and POLE (sequencing only); EPCAM and GREM1 (deletion/duplication only). The test report has been scanned into EPIC and is located under the Molecular Pathology section of the Results Review tab.  A portion of the result report is included below for reference.     We discussed with [name] that because current genetic testing is not perfect, it is possible there may be a gene mutation in one of these genes that current testing cannot detect, but that chance is small.  We also discussed, that there could be another gene that has not yet been discovered, or that we have not yet tested, that is responsible for the cancer diagnoses in the family. It is also possible there is a hereditary cause for the cancer in the family that [name] did not inherit and therefore was not identified in his testing.  Therefore, it is important to remain in touch with cancer genetics in the future so that we can continue to offer [name] the most up to date genetic testing.   ADDITIONAL GENETIC TESTING: We discussed with [name] that his genetic testing was fairly extensive.  If there are genes identified to increase cancer risk that can be analyzed in the future, we would be happy to discuss and coordinate this testing at that time.    CANCER SCREENING RECOMMENDATIONS: [name] test result is considered negative (normal).  This means that we have not identified a hereditary cause for his family history of polyps  at this time. Most cancers happen by chance and this negative test suggests that his personal history of polyps may fall into this category.    Possible reasons for [name]'s negative genetic test include:  1. There may be a gene mutation in one of these genes that current testing methods cannot detect but that chance is small.  2. There could be another gene that has  not yet been discovered, or that we have not yet tested, that is responsible for the cancer diagnoses in the family.  3.  There may be no hereditary risk for cancer in the family. The cancers in [name] and/or his family may be sporadic/familial or due to other genetic and environmental factors. 4. It is also possible there is a hereditary cause for the cancer in the family that [name] did not inherit.  Therefore, it is recommended he continue to follow the cancer management and screening guidelines provided by his primary healthcare provider. An individual's cancer risk and medical management are not determined by genetic test results alone. Overall cancer risk assessment incorporates additional factors, including personal medical history, family history, and any available genetic information that may result in a personalized plan for cancer prevention and surveillance  This negative genetic test simply tells [place] that we cannot yet define why [name] has had an increased number of colorectal polyps. [name] medical management and screening should be based on the prospect that he will likely form more colon polyps in the future and should, therefore, undergo more frequent colonoscopy screening at intervals determined by his GI providers.  We also recommended that [name] have an upper endoscopy periodically.  RECOMMENDATIONS FOR FAMILY MEMBERS:   Since he did not inherit a identifiable mutation in a cancer predisposition gene included on this panel, his children could not have inherited a known mutation from his in one of these genes. Individuals in this family might be at some increased risk of developing cancer, over the general population risk, simply due to the family history of cancer.  We recommended women in this family have a yearly mammogram beginning at age 7, or 74 years younger than the earliest onset of cancer, an annual clinical breast exam, and perform monthly  breast self-exams. Women in this family should also have a gynecological exam as recommended by their primary provider. All family members should be referred for colonoscopy starting at age 74, or 32 years younger than the earliest onset of cancer.  FOLLOW-UP: Lastly, we discussed with [name] that cancer genetics is a rapidly advancing field and it is possible that new genetic tests will be appropriate for him and/or his family members in the future. We encouraged him to remain in contact with cancer genetics on an annual basis so we can update his personal and family histories and let him know of advances in cancer genetics that may benefit this family.   Our contact number was provided. [name] questions were answered to his satisfaction, and he knows he is welcome to call us at anytime with additional questions or concerns.   Maylon Cos, MS, [facility] Licensed, Certified Genetic Counselor [email or web address]

## 2023-09-24 NOTE — Telephone Encounter (Signed)
Revealed negative genetic testing.  Discussed that we do not know why he has polyposis. It could be due to a different gene that we are not testing, or maybe our current technology may not be able to pick something up.  It will be important for him to keep in contact with genetics to keep up with whether additional testing may be needed.    

## 2023-09-27 ENCOUNTER — Ambulatory Visit (HOSPITAL_BASED_OUTPATIENT_CLINIC_OR_DEPARTMENT_OTHER): Payer: Medicare Other | Admitting: Cardiology

## 2023-09-27 ENCOUNTER — Encounter (HOSPITAL_BASED_OUTPATIENT_CLINIC_OR_DEPARTMENT_OTHER): Payer: Self-pay | Admitting: Cardiology

## 2023-09-27 VITALS — BP 120/60 | HR 63 | Ht 73.0 in | Wt 185.0 lb

## 2023-09-27 DIAGNOSIS — Z7189 Other specified counseling: Secondary | ICD-10-CM | POA: Diagnosis not present

## 2023-09-27 DIAGNOSIS — I10 Essential (primary) hypertension: Secondary | ICD-10-CM | POA: Diagnosis not present

## 2023-09-27 DIAGNOSIS — R001 Bradycardia, unspecified: Secondary | ICD-10-CM | POA: Diagnosis not present

## 2023-09-27 DIAGNOSIS — E119 Type 2 diabetes mellitus without complications: Secondary | ICD-10-CM | POA: Diagnosis not present

## 2023-09-27 DIAGNOSIS — I2510 Atherosclerotic heart disease of native coronary artery without angina pectoris: Secondary | ICD-10-CM | POA: Diagnosis not present

## 2023-09-27 NOTE — Progress Notes (Signed)
 Cardiology Office Note:  .   Date:  09/27/2023  ID:  Cory Walls, DOB [birth year], MRN [id number] PCP: Cory Fillers, MD  [facility] Providers Cardiologist:  Jodelle Red, MD {  History of Present Illness: .   Cory Walls is a 78 y.o. male with PMH coronary artery calcification, type ii diabetes, and hyperlipidemia. He saw Dr. Shari Prows 09/2022 for bradycardia and established care with me on 09/27/23.  Pertinent CV history: ECG 09/2022 with inferior q waves. Echo with EF 50-55% with hypokinesis of the basal-mid inferolateral wall. Exercise myoview without ischemia or infarction, but noted to have paroxysmal atrial fibrillation with exercise. 7 day monitor did not show afib, HR range 26-164 with average of 70 bpm.   Today: Wife passed away 07-05-2023, offered my condolences. He is trying to maintain his health in the face of this.   Goes to the gym daily, does spin class for 40 minutes regularly and elliptical on other days. ECG with PAC/PVC, he is asymptomatic. No further lightheadedness/presyncope   No bleeding issues with aspirin. Reviewed his coronary calcifications, management recommendations. Tolerating pravastatin, reviewed labs from 04/2023.   Veyr healthy eating. Overall in excellent health. Did review opportunities to cut back on saturated fat.  ROS: Denies chest pain, shortness of breath at rest or with normal exertion. No PND, orthopnea, LE edema or unexpected weight gain. No syncope or palpitations. ROS otherwise negative except as noted.   Studies Reviewed: [facility]    EKG:  EKG Interpretation Date/Time:  Monday September 27 2023 07:51:02 EDT Ventricular Rate:  63 PR Interval:  190 QRS Duration:  84 QT Interval:  360 QTC Calculation: 368 R Axis:   32  Text Interpretation: Sinus rhythm with occasional Premature ventricular complexes and Premature atrial complexes When compared with ECG of 04-Jun-2021 10:17, Premature ventricular complexes are now Present  Premature atrial complexes are now Present Confirmed by Jodelle Red [phone]) on 09/27/2023 8:06:47 AM    Physical Exam:   VS:  BP 120/60 (BP Location: Right Arm, Patient Position: Sitting, Cuff Size: Normal)   Pulse 63   Ht 6\' 1"  (1.854 m)   Wt 185 lb (83.9 kg)   SpO2 98%   BMI 24.41 kg/m    Wt Readings from Last 3 Encounters:  09/27/23 185 lb (83.9 kg)  08/20/23 180 lb (81.6 kg)  07/22/23 180 lb (81.6 kg)    GEN: Well nourished, well developed in no acute distress HEENT: Normal, moist mucous membranes NECK: No JVD CARDIAC: regular rhythm, normal S1 and S2, no rubs or gallops. No murmur. VASCULAR: Radial and DP pulses 2+ bilaterally. No carotid bruits RESPIRATORY:  Clear to auscultation without rales, wheezing or rhonchi  ABDOMEN: Soft, non-tender, non-distended MUSCULOSKELETAL:  Ambulates independently SKIN: Warm and dry, no edema NEUROLOGIC:  Alert and oriented x 3. No focal neuro deficits noted. PSYCHIATRIC:  Normal affect    ASSESSMENT AND PLAN: .    Asymptomatic bradycardia PACs/PVCs -HR normal for his level of cardiovascular fitness -asymptomatic ectopy -monitor reviewed, unremarkable  Coronary artery calcification Hypercholesterolemia -tolerating aspirin 81 mg and pravastatin without issues -LDL 88. Discussed goal of <70 for aggressive management. He will work to cut out some saturated fat -prior stress test without ischemia -otherwise excellent lifestyle -reviewed red flag warning signs that need immediate medical attention  Hypertension -well controlled on losartan  Type II diabetes, no complications, not on insulin -well controlled on metformin -last A1c 6.8  CV risk counseling and prevention -recommend heart healthy/Mediterranean diet, with whole  grains, fruits, vegetable, fish, lean meats, nuts, and olive oil. Limit salt. -recommend moderate walking, 3-5 times/week for 30-50 minutes each session. Aim for at least 150 minutes.week. Goal should  be pace of 3 miles/hours, or walking 1.5 miles in 30 minutes -recommend avoidance of tobacco products. Avoid excess alcohol.  Dispo: 1 year or sooner as needed  Signed, Cory Donning, MD   Cory Donning, MD, PhD, [facility]  [facility]  [place]  [facility] at [facility] at [facility] [address] [place], [place] [address] [phone]

## 2023-09-27 NOTE — Patient Instructions (Signed)
 Medication Instructions:  No Changes *If you need a refill on your cardiac medications before your next appointment, please call your pharmacy*  Lab Work: No labs If you have labs (blood work) drawn today and your tests are completely normal, you will receive your results only by: MyChart Message (if you have MyChart) OR A paper copy in the mail If you have any lab test that is abnormal or we need to change your treatment, we will call you to review the results.  Testing/Procedures: No Testing  Follow-Up: At [facility], you and your health needs are our priority.  As part of our continuing mission to provide you with exceptional heart care, our providers are all part of one team.  This team includes your primary Cardiologist (physician) and Advanced Practice Providers or APPs (Physician Assistants and Nurse Practitioners) who all work together to provide you with the care you need, when you need it.  Your next appointment:   1 year(s)  Provider:   Sheryle Donning, MD We recommend signing up for the patient portal called "MyChart".  Sign up information is provided on this After Visit Summary.  MyChart is used to connect with patients for Virtual Visits (Telemedicine).  Patients are able to view lab/test results, encounter notes, upcoming appointments, etc.  Non-urgent messages can be sent to your provider as well.   To learn more about what you can do with MyChart, go to [email or web address].

## 2023-10-04 DIAGNOSIS — R200 Anesthesia of skin: Secondary | ICD-10-CM | POA: Diagnosis not present

## 2023-10-04 DIAGNOSIS — M51362 Other intervertebral disc degeneration, lumbar region with discogenic back pain and lower extremity pain: Secondary | ICD-10-CM | POA: Diagnosis not present

## 2023-10-04 DIAGNOSIS — I2510 Atherosclerotic heart disease of native coronary artery without angina pectoris: Secondary | ICD-10-CM | POA: Diagnosis not present

## 2023-10-04 DIAGNOSIS — I10 Essential (primary) hypertension: Secondary | ICD-10-CM | POA: Diagnosis not present

## 2023-10-04 DIAGNOSIS — R202 Paresthesia of skin: Secondary | ICD-10-CM | POA: Diagnosis not present

## 2023-11-09 DIAGNOSIS — E1129 Type 2 diabetes mellitus with other diabetic kidney complication: Secondary | ICD-10-CM | POA: Diagnosis not present

## 2023-11-09 DIAGNOSIS — E782 Mixed hyperlipidemia: Secondary | ICD-10-CM | POA: Diagnosis not present

## 2023-11-09 DIAGNOSIS — I2510 Atherosclerotic heart disease of native coronary artery without angina pectoris: Secondary | ICD-10-CM | POA: Diagnosis not present

## 2023-11-09 DIAGNOSIS — R809 Proteinuria, unspecified: Secondary | ICD-10-CM | POA: Diagnosis not present

## 2023-11-09 DIAGNOSIS — I10 Essential (primary) hypertension: Secondary | ICD-10-CM | POA: Diagnosis not present

## 2023-12-01 ENCOUNTER — Other Ambulatory Visit (HOSPITAL_BASED_OUTPATIENT_CLINIC_OR_DEPARTMENT_OTHER): Payer: Self-pay | Admitting: Internal Medicine

## 2023-12-01 DIAGNOSIS — E782 Mixed hyperlipidemia: Secondary | ICD-10-CM

## 2023-12-15 ENCOUNTER — Encounter (HOSPITAL_BASED_OUTPATIENT_CLINIC_OR_DEPARTMENT_OTHER): Payer: Self-pay | Admitting: Internal Medicine

## 2023-12-15 DIAGNOSIS — I2510 Atherosclerotic heart disease of native coronary artery without angina pectoris: Secondary | ICD-10-CM

## 2023-12-15 DIAGNOSIS — E782 Mixed hyperlipidemia: Secondary | ICD-10-CM

## 2024-01-03 ENCOUNTER — Other Ambulatory Visit: Payer: Self-pay

## 2024-01-03 ENCOUNTER — Emergency Department (HOSPITAL_BASED_OUTPATIENT_CLINIC_OR_DEPARTMENT_OTHER)
Admission: EM | Admit: 2024-01-03 | Discharge: 2024-01-03 | Disposition: A | Discharge status: Home / Self Care | Attending: Emergency Medicine | Admitting: Emergency Medicine

## 2024-01-03 ENCOUNTER — Telehealth: Payer: Self-pay | Admitting: Cardiology

## 2024-01-03 ENCOUNTER — Emergency Department (HOSPITAL_BASED_OUTPATIENT_CLINIC_OR_DEPARTMENT_OTHER): Admitting: Radiology

## 2024-01-03 ENCOUNTER — Encounter (HOSPITAL_BASED_OUTPATIENT_CLINIC_OR_DEPARTMENT_OTHER): Payer: Self-pay

## 2024-01-03 DIAGNOSIS — Z7982 Long term (current) use of aspirin: Secondary | ICD-10-CM | POA: Insufficient documentation

## 2024-01-03 DIAGNOSIS — Z96612 Presence of left artificial shoulder joint: Secondary | ICD-10-CM | POA: Diagnosis not present

## 2024-01-03 DIAGNOSIS — R079 Chest pain, unspecified: Secondary | ICD-10-CM | POA: Insufficient documentation

## 2024-01-03 DIAGNOSIS — E119 Type 2 diabetes mellitus without complications: Secondary | ICD-10-CM | POA: Insufficient documentation

## 2024-01-03 DIAGNOSIS — R0789 Other chest pain: Secondary | ICD-10-CM | POA: Diagnosis not present

## 2024-01-03 DIAGNOSIS — D649 Anemia, unspecified: Secondary | ICD-10-CM | POA: Diagnosis not present

## 2024-01-03 DIAGNOSIS — Z7984 Long term (current) use of oral hypoglycemic drugs: Secondary | ICD-10-CM | POA: Insufficient documentation

## 2024-01-03 LAB — BASIC METABOLIC PANEL WITH GFR
Anion gap: 11 (ref 5–15)
BUN: 17 mg/dL (ref 8–23)
CO2: 27 mmol/L (ref 22–32)
Calcium: 9.6 mg/dL (ref 8.9–10.3)
Chloride: 104 mmol/L (ref 98–111)
Creatinine, Ser: 0.9 mg/dL (ref 0.61–1.24)
GFR, Estimated: >60 mL/min (ref >60)
Glucose, Bld: 126 mg/dL — ABNORMAL HIGH (ref 70–99)
Potassium: 4 mmol/L (ref 3.5–5.1)
Sodium: 143 mmol/L (ref 135–145)

## 2024-01-03 LAB — CBC
HCT: 33.6 % — ABNORMAL LOW (ref 39.0–52.0)
Hemoglobin: 11.3 g/dL — ABNORMAL LOW (ref 13.0–17.0)
MCH: 30.5 pg (ref 26.0–34.0)
MCHC: 33.6 g/dL (ref 30.0–36.0)
MCV: 90.8 fL (ref 80.0–100.0)
Platelets: 220 K/uL (ref 150–400)
RBC: 3.7 MIL/uL — ABNORMAL LOW (ref 4.22–5.81)
RDW: 14.2 % (ref 11.5–15.5)
WBC: 11 K/uL — ABNORMAL HIGH (ref 4.0–10.5)
nRBC: 0 % (ref 0.0–0.2)

## 2024-01-03 LAB — TROPONIN T, HIGH SENSITIVITY: Troponin T High Sensitivity: <15 ng/L (ref <19)

## 2024-01-03 NOTE — ED Triage Notes (Signed)
 Pt c/o CP/ tightness onset at the gym this AM- didn't feel right doing my exercises. Lightheaded all day, feeling off.  Denies NV, SHOB, radiation of pain

## 2024-01-03 NOTE — Telephone Encounter (Signed)
 Pt c/o BP issue: STAT if pt c/o blurred vision, one-sided weakness or slurred speech  1. What are your last 5 BP readings? 120/55 HR 60  2. Are you having any other symptoms (ex. Dizziness, headache, blurred vision, passed out)? Lightheadedness, No energy   3. What is your BP issue? Oxygen level was 96/97   Pt c/o of Chest Pain: STAT if active (IN THIS MOMENT) CP, including tightness, pressure, jaw pain, shoulder/upper arm/back pain, SOB, nausea, and vomiting.  1. Are you having CP right now (tightness, pressure, or discomfort)? Tightness   2. Are you experiencing any other symptoms (ex. SOB, nausea, vomiting, sweating)? no  3. How long have you been experiencing CP? Couple of hrs ago   4. Is your CP continuous or coming and going? Coming and going   5. Have you taken Nitroglycerin? no  6. If CP returns before callback, please consider calling 911. ?

## 2024-01-03 NOTE — Discharge Instructions (Signed)
 Evaluation today was overall reassuring.  Please follow-up with your cardiologist.  If your chest pain returns or you have shortness of breath or palpitations or any other concerning symptom please return to the ED for further evaluation.

## 2024-01-03 NOTE — Telephone Encounter (Addendum)
 Spoke with patient who stated he went to [facility] class this morning and he just didn't feel right, had some lightheadedness Went home and ate, thinks well hydrated Blood pressure 120/55, HR has been in the 60's all day but about 10 points higher than normal. Started having chest tightness/heaviness around noon.  Does not feel like an elephant sitting on my chest but maybe like 10 lb cat Intermittent diaphoresis  Does have the diaphoresis with anxiety but stated never the chest tightness   Discussed with Reche ORN NP who advised ED for evaluation  Advised patient, verbalized understanding   Patient is scheduled for appointment with Reche ORN NP tomorrow afternoon if ED allows him to go home

## 2024-01-03 NOTE — ED Provider Notes (Signed)
 [place] EMERGENCY DEPARTMENT AT [facility] Provider Note   CSN: [id number] Arrival date & time: 01/03/24  1736     Patient presents with: Chest Pain  HPI Cory Walls is a 78 y.o. male with history of hyperlipidemia, diabetes presenting for chest pain.  He states he went to the gym this morning did not feel quite right.  Felt somewhat lightheaded.  Went home and about 6 hours later early afternoon he started to have pain in his left chest with intermittent shortness of breath.  He states it felt like his cat was sitting on his chest.  He states that his chest pain has resolved about 2 hours ago still without shortness of breath.  Reports that he does have a appointment with his cardiologist tomorrow.  Denies history of blood clots, recent mobilization and surgeries.    Chest Pain      Prior to Admission medications   Medication Sig Start Date End Date Taking? Authorizing Provider  allopurinol (ZYLOPRIM) 100 MG tablet Take 100 mg by mouth every evening.    [provider]  aspirin 81 MG tablet Take 81 mg by mouth daily.    [provider]  cholecalciferol (VITAMIN D3) 25 MCG (1000 UNIT) tablet Take 2,000 Units by mouth daily.    [provider]  fish oil-omega-3 fatty acids 1000 MG capsule Take 1 g by mouth 2 (two) times daily.    [provider]  Glucosamine-Chondroit-Vit C-Mn (GLUCOSAMINE CHONDR 1500 COMPLX) CAPS Take 2 capsules by mouth daily.    [provider]  losartan (COZAAR) 25 MG tablet Take 12.5 mg by mouth every evening. 03/17/21   [provider]  Magnesium 250 MG TABS Take 250 mg by mouth daily.    [provider]  metFORMIN (GLUCOPHAGE-XR) 500 MG 24 hr tablet Take 500 mg by mouth every evening. 06/15/20   [provider]  metoCLOPramide  (REGLAN ) 10 MG tablet Take 1 tablet (10 mg total) by mouth as directed. 07/22/23   Avram Lupita BRAVO, MD  Multiple Vitamins-Minerals [facility] ADULT 50+) CAPS  Take 1 capsule by mouth daily.    [provider]  MULTIPLE VITAMINS-MINERALS PO Take 1 tablet by mouth daily.    [provider]  OVER THE COUNTER MEDICATION Apply 1 application topically daily as needed (arthritis pain). Australian Dream Cream    [provider]  pravastatin (PRAVACHOL) 40 MG tablet Take 40 mg by mouth every evening.    [provider]  SUPER B COMPLEX/C PO  05/30/23   [provider]  vitamin C (ASCORBIC ACID) 500 MG tablet Take 1,000 mg by mouth daily.    [provider]  vitamin E 400 UNIT capsule Take 400 Units by mouth daily.    [provider]    Allergies: Penicillins and Moviprep [peg-kcl-nacl-nasulf-na asc-c]    Review of Systems  Cardiovascular:  Positive for chest pain.    Updated Vital Signs BP 132/74 (BP Location: Right Arm)   Pulse (!) 57   Temp 98.1 F (36.7 C)   Resp 16   SpO2 99%   Physical Exam Vitals and nursing note reviewed.  HENT:     Head: Normocephalic and atraumatic.     Mouth/Throat:     Mouth: Mucous membranes are moist.  Eyes:     General:        Right eye: No discharge.        Left eye: No discharge.     Conjunctiva/sclera: Conjunctivae normal.  Cardiovascular:     Rate and Rhythm: Normal rate and regular rhythm.     Pulses: Normal pulses.     Heart sounds: Normal heart sounds.  Pulmonary:     Effort: Pulmonary effort is normal.     Breath sounds: Normal breath sounds.  Abdominal:     General: Abdomen is flat.     Palpations: Abdomen is soft.  Skin:    General: Skin is warm and dry.  Neurological:     General: No focal deficit present.  Psychiatric:        Mood and Affect: Mood normal.     (all labs ordered are listed, but only abnormal results are displayed) Labs Reviewed  BASIC METABOLIC PANEL WITH GFR - Abnormal; Notable for the following components:      Result Value   Glucose, Bld 126 (*)    All other components within normal limits  CBC -  Abnormal; Notable for the following components:   WBC 11.0 (*)    RBC 3.70 (*)    Hemoglobin 11.3 (*)    HCT 33.6 (*)    All other components within normal limits  TROPONIN T, HIGH SENSITIVITY  TROPONIN T, HIGH SENSITIVITY    EKG: EKG Interpretation Date/Time:  Monday January 03 2024 17:46:37 EDT Ventricular Rate:  65 PR Interval:  202 QRS Duration:  88 QT Interval:  368 QTC Calculation: 382 R Axis:   47  Text Interpretation: Sinus rhythm with occasional Premature ventricular complexes and Premature atrial complexes Otherwise normal ECG When compared with ECG of 27-Sep-2023 07:51, No significant change was found Confirmed by Dean Clarity [phone]) on 01/03/2024 8:11:55 PM  Radiology: [facility] Chest 2 View Result Date: 01/03/2024 CLINICAL DATA:  cp EXAM: CHEST - 2 VIEW COMPARISON:  Chest x-ray 07/14/2007 FINDINGS: The heart and mediastinal contours are unchanged. No focal consolidation. No pulmonary edema. No pleural effusion. No pneumothorax. No acute osseous abnormality. Partially visualized reverse left shoulder arthroplasty. IMPRESSION: No active cardiopulmonary disease. Electronically Signed   By: Morgane  Naveau M.D.   On: 01/03/2024 18:45     Procedures   Medications Ordered in the ED - No data to display              HEART Score: 3                    Medical Decision Making Amount and/or Complexity of Data Reviewed Labs: ordered. Radiology: ordered.   Initial Impression and Ddx 78 year old well-appearing male presenting for chest pain.  Exam is unremarkable.  DDx includes ACS, PE, aortic dissection, pneumothorax, other.  Patient PMH that increases complexity of ED encounter:  history of hyperlipidemia, diabetes  Interpretation of Diagnostics - I independent reviewed and interpreted the labs as followed: mild anemia  - I independently visualized the following imaging with scope of interpretation limited to determining acute life threatening conditions related to  emergency care: CXR, which revealed no acute disease  - I personally reviewed and interpreted EKG which was non ischemic.   Patient Reassessment and Ultimate Disposition/Management On reassessment continued asymptomatic.  Workup was reassuring.  Patient assured me that he has an appointment with his cardiologist tomorrow afternoon.  Advised that he go to that appointment.  We discussed return precautions.  Discharged in good condition.  Patient management required discussion with the following services or consulting groups:  None  Complexity of Problems Addressed Acute complicated illness or Injury  Additional Data Reviewed and Analyzed Further history obtained from: Further history  from spouse/family member, Past medical history and medications listed in the EMR, and Prior ED visit notes  Patient Encounter Risk Assessment Consideration of hospitalization      Final diagnoses:  Chest pain, unspecified type    ED Discharge Orders     None          Lang Norleen POUR, PA-C 01/03/24 2052    Dean Clarity, MD 01/04/24 [phone]

## 2024-01-04 ENCOUNTER — Ambulatory Visit (INDEPENDENT_AMBULATORY_CARE_PROVIDER_SITE_OTHER): Admitting: Family

## 2024-01-04 ENCOUNTER — Encounter (HOSPITAL_BASED_OUTPATIENT_CLINIC_OR_DEPARTMENT_OTHER): Payer: Self-pay | Admitting: Family

## 2024-01-04 VITALS — BP 108/64 | HR 62 | Ht 72.0 in | Wt 186.0 lb

## 2024-01-04 DIAGNOSIS — E785 Hyperlipidemia, unspecified: Secondary | ICD-10-CM | POA: Diagnosis not present

## 2024-01-04 DIAGNOSIS — I2510 Atherosclerotic heart disease of native coronary artery without angina pectoris: Secondary | ICD-10-CM

## 2024-01-04 DIAGNOSIS — I493 Ventricular premature depolarization: Secondary | ICD-10-CM | POA: Diagnosis not present

## 2024-01-04 DIAGNOSIS — D649 Anemia, unspecified: Secondary | ICD-10-CM | POA: Diagnosis not present

## 2024-01-04 DIAGNOSIS — I10 Essential (primary) hypertension: Secondary | ICD-10-CM

## 2024-01-04 DIAGNOSIS — R001 Bradycardia, unspecified: Secondary | ICD-10-CM

## 2024-01-04 NOTE — Patient Instructions (Addendum)
 Medication Instructions:  Continue your current medications.   *If you need a refill on your cardiac medications before your next appointment, please call your pharmacy*  Lab Work: Your physician recommends that you return for lab work in 1 month for CBC  If you have labs (blood work) drawn today and your tests are completely normal, you will receive your results only by: MyChart Message (if you have MyChart) OR A paper copy in the mail If you have any lab test that is abnormal or we need to change your treatment, we will call you to review the results.  Testing/Procedures: Your EKG in the ER looked good with only an occasional early beat  Follow-Up: At [facility], you and your health needs are our priority.  As part of our continuing mission to provide you with exceptional heart care, our providers are all part of one team.  This team includes your primary Cardiologist (physician) and Advanced Practice Providers or APPs (Physician Assistants and Nurse Practitioners) who all work together to provide you with the care you need, when you need it.  Your next appointment:   09/2024 with Dr. Lonni We will contact you closer to time to schedule  We recommend signing up for the patient portal called MyChart.  Sign up information is provided on this After Visit Summary.  MyChart is used to connect with patients for Virtual Visits (Telemedicine).  Patients are able to view lab/test results, encounter notes, upcoming appointments, etc.  Non-urgent messages can be sent to your provider as well.   To learn more about what you can do with MyChart, go to [email or web address].   Other Instructions Recommend adding dietary sources of iron to help treat anemia noted in ER visit  Increase fluid intake to help prevent elevated heart rates

## 2024-01-04 NOTE — Progress Notes (Signed)
 Cardiology Office Note   Date:  01/04/2024  ID:  Cory Walls, DOB June 11, 1946, MRN [id number] PCP: [name] MATSU, MD  [facility] Providers Cardiologist:  Shelda Bruckner, MD     History of Present Illness  Discussed the use of AI scribe software for clinical note transcription with the patient, who gave verbal consent to proceed.  History of Present Illness [name] is a 78 year old male with type 2 diabetes, coronary artery calcification, hyperlipidemia, bradycardia.    ED visit yesterday for chest pain with unremarkable troponin x1, CBC wbc 11 and Hb 11.3, BMP unremarkable. NSR 65 bpmw with PAC and PVC but no acute ST/T wave changes.   He visited the emergency room yesterday due to concerns about heart rate and blood pressure. At home,  recent 10 PE reading of 120/55. His pulse rate has increased by about ten points, typically in the mid to low fifties, but recently reaching the high 130s during exercise.  He experiences dizziness and a higher heart rate during workouts. He exercises regularly, attending spin classes and using the elliptical, without experiencing chest pain.  He takes a half tablet of an losartan, adjusted from a full tablet due to dizziness. He also takes a multivitamin, consumes a diet including nuts and leafy greens, drinks one to two cups of coffee daily, and acknowledges needing to increase water intake.  ROS: Please see the history of present illness.    All other systems reviewed and are negative.   Studies Reviewed      Cardiac Studies & Procedures   ______________________________________________________________________________________________   STRESS TESTS  MYOCARDIAL PERFUSION IMAGING 10/30/2022  Interpretation Summary   Findings are consistent with no ischemia. The study is intermediate risk.   No ST deviation was noted. Arrhythmias during stress: rare PVCs, atrial fibrillation.   LV perfusion is normal.    Left ventricular function is abnormal. Global function is mildly reduced. End diastolic cavity size is mildly enlarged. End systolic cavity size is normal.   Prior study not available for comparison.  No reversible ischemia. Good exercise tolerance, however, noted to have paroxysmal atrial fibrillation during Phase 3 of the stress test, which resolved during recovery. Occasional PVC's were noted. LVEF 49% with normal-appearing wall motion. Clinical correlation with echo is recommended. This is an intermediate risk study, based on calculated LVEF. No prior study for comparison.   ECHOCARDIOGRAM  ECHOCARDIOGRAM COMPLETE 10/30/2022  Narrative ECHOCARDIOGRAM REPORT    Patient Name:   Cory Walls Date of Exam: 10/30/2022 Medical Rec #:  [id number]       Height:       72.0 in Accession #:    [id number]      Weight:       194.0 lb Date of Birth:  [birth year]       BSA:          2.103 m Patient Age:    78 years        BP:           120/70 mmHg Patient Gender: M               HR:           65 bpm. Exam Location:  [address]  Procedure: 2D Echo, Cardiac Doppler and Color Doppler  Indications:    R94.31 Abnormal EKG  History:        Patient has no prior history of Echocardiogram examinations. CAD, Abnormal ECG, Arrythmias:Bradycardia; Risk Factors:Dyslipidemia and Diabetes.  Sonographer:    Elsie Bohr RDCS Referring Phys: [id number] HEATHER E [place]  IMPRESSIONS   1. Left ventricular ejection fraction, by estimation, is 50 to 55%. The left ventricle has low normal function. The left ventricle demonstrates regional wall motion abnormalities (see scoring diagram/findings for description). There is mild asymmetric left ventricular hypertrophy of the basal-septal segment. Left ventricular diastolic parameters are consistent with Grade I diastolic dysfunction (impaired relaxation). There is hypokinesis of the left ventricular, basal-mid inferolateral wall. 2. Right ventricular systolic  function is normal. The right ventricular size is normal. 3. Left atrial size was mildly dilated. 4. The mitral valve is normal in structure. Trivial mitral valve regurgitation. No evidence of mitral stenosis. 5. The aortic valve is tricuspid. There is mild calcification of the aortic valve. Aortic valve regurgitation is mild. Aortic valve sclerosis/calcification is present, without any evidence of aortic stenosis. 6. Aortic dilatation noted. There is borderline dilatation of the aortic root, measuring 39 mm. There is borderline dilatation of the ascending aorta, measuring 38 mm. 7. The inferior vena cava is normal in size with greater than 50% respiratory variability, suggesting right atrial pressure of 3 mmHg.  FINDINGS Left Ventricle: Left ventricular ejection fraction, by estimation, is 50 to 55%. The left ventricle has low normal function. The left ventricle demonstrates regional wall motion abnormalities. The left ventricular internal cavity size was normal in size. There is mild asymmetric left ventricular hypertrophy of the basal-septal segment. Left ventricular diastolic parameters are consistent with Grade I diastolic dysfunction (impaired relaxation).  Right Ventricle: The right ventricular size is normal. No increase in right ventricular wall thickness. Right ventricular systolic function is normal.  Left Atrium: Left atrial size was mildly dilated.  Right Atrium: Right atrial size was normal in size.  Pericardium: There is no evidence of pericardial effusion.  Mitral Valve: The mitral valve is normal in structure. Trivial mitral valve regurgitation. No evidence of mitral valve stenosis.  Tricuspid Valve: The tricuspid valve is normal in structure. Tricuspid valve regurgitation is mild . No evidence of tricuspid stenosis.  Aortic Valve: The aortic valve is tricuspid. There is mild calcification of the aortic valve. Aortic valve regurgitation is mild. Aortic regurgitation PHT  measures 786 msec. Aortic valve sclerosis/calcification is present, without any evidence of aortic stenosis.  Pulmonic Valve: The pulmonic valve was normal in structure. Pulmonic valve regurgitation is not visualized. No evidence of pulmonic stenosis.  Aorta: Aortic dilatation noted. There is borderline dilatation of the aortic root, measuring 39 mm. There is borderline dilatation of the ascending aorta, measuring 38 mm.  Venous: The inferior vena cava is normal in size with greater than 50% respiratory variability, suggesting right atrial pressure of 3 mmHg.  IAS/Shunts: No atrial level shunt detected by color flow Doppler.   LEFT VENTRICLE PLAX 2D LVIDd:         5.10 cm   Diastology LVIDs:         3.20 cm   LV e' medial:    8.48 cm/s LV PW:         0.90 cm   LV E/e' medial:  8.8 LV IVS:        1.40 cm   LV e' lateral:   8.09 cm/s LVOT diam:     2.60 cm   LV E/e' lateral: 9.3 LV SV:         114 LV SV Index:   54 LVOT Area:     5.31 cm   RIGHT VENTRICLE  IVC RV S prime:     12.87 cm/s  IVC diam: 1.40 cm TAPSE (M-mode): 2.3 cm RVSP:           27.4 mmHg  LEFT ATRIUM             Index        RIGHT ATRIUM           Index LA diam:        3.50 cm 1.66 cm/m   RA Pressure: 3.00 mmHg LA Vol (A2C):   43.3 ml 20.59 ml/m  RA Area:     17.00 cm LA Vol (A4C):   54.8 ml 26.06 ml/m  RA Volume:   45.20 ml  21.49 ml/m LA Biplane Vol: 48.6 ml 23.11 ml/m AORTIC VALVE LVOT Vmax:   105.80 cm/s LVOT Vmean:  69.720 cm/s LVOT VTI:    0.215 m AI PHT:      786 msec  AORTA Ao Root diam: 3.90 cm Ao Asc diam:  3.80 cm  MITRAL VALVE               TRICUSPID VALVE MV Area (PHT): 3.69 cm    TR Peak grad:   24.4 mmHg MV Decel Time: 206 msec    TR Vmax:        247.00 cm/s MV E velocity: 74.94 cm/s  Estimated RAP:  3.00 mmHg MV A velocity: 87.52 cm/s  RVSP:           27.4 mmHg MV E/A ratio:  0.86 SHUNTS Systemic VTI:  0.22 m Systemic Diam: 2.60 cm  Toribio Fuel  MD Electronically signed by Toribio Fuel MD Signature Date/Time: 10/30/2022/4:21:16 PM    Final    MONITORS  LONG TERM MONITOR (3-14 DAYS) 11/26/2022  Narrative   Patch wear time was 6 days and 23 hours   Predominant rhythm was NSR with average HR 70bpm   1 runs of SVT lasting 8 beats   Occasional SVE (3.7%), rare VE (<1%)   No Afib or sustained arrhythmias   Patient triggered events correlated with sinus tachycardia with PACs   Patch Wear Time:  6 days and 23 hours (2024-05-25T06:26:30-0400 to 2024-06-01T06:09:25-0400)  Patient had a min HR of 26 bpm, max HR of 164 bpm, and avg HR of 70 bpm. Predominant underlying rhythm was Sinus Rhythm. 1 run of Supraventricular Tachycardia occurred lasting 8 beats with a max rate of 150 bpm (avg 137 bpm). Second Degree AV Block-Mobitz I (Wenckebach) was present. Isolated SVEs were occasional (3.7%, 25511), SVE Couplets were rare (<1.0%, 482), and SVE Triplets were rare (<1.0%, 54). Isolated VEs were rare (<1.0%), VE Couplets were rare (<1.0%), and no VE Triplets were present. Ventricular Bigeminy was present.  Powell Sorrow, MD       ______________________________________________________________________________________________      Risk Assessment/Calculations           Physical Exam VS:  BP 108/64 (BP Location: Right Arm, Patient Position: Sitting, Cuff Size: Normal)   Pulse 62   Ht 6' (1.829 m)   Wt 186 lb (84.4 kg)   SpO2 98%   BMI 25.23 kg/m        Wt Readings from Last 3 Encounters:  01/04/24 186 lb (84.4 kg)  09/27/23 185 lb (83.9 kg)  08/20/23 180 lb (81.6 kg)    GEN: Well nourished, well developed in no acute distress NECK: No JVD; No carotid bruits CARDIAC: RRR, no murmurs, rubs, gallops RESPIRATORY:  Clear to auscultation without rales, wheezing or rhonchi  ABDOMEN: Soft, non-tender, non-distended EXTREMITIES:  No edema; No deformity   ASSESSMENT AND PLAN  Asymptomatic bradycardia/PAC/PVC-not on AV  nodal blocking agent due to bradycardia.  Palpitations infrequent and overall not bothersome.  Encouraged to hydrate well, limit caffeine, manage stress well.  Coronary artery calcification/HLD, LDL goal less than 70-recent ED visit with reassuring workup.  No recurrent chest pain as he has exercised since that time.  Notes his prior episode manage in March related to stress as he for moment of grief while driving as he lost his wife last year.  He exercises regularly through strenuous spin classes, low suspicion for ischemia .  Ischemic evaluation recommended at this time.  GDMT aspirin 81 mg daily, pravastatin 40 mg daily.  HTN-BP well-controlled on present regimen losartan 12.5 mg daily. Discussed to monitor BP at home at least 2 hours after medications and sitting for 5-10 minutes.   DM2-managed by primary care provider.  Anemia - noted on ED labs with Hb 11.3. denies hematuria, melena. Add dietary sources of iron and update CBC in 1 month. If persistent anemia, consider eval by hematology and/or GI.       Dispo: follow up 09/2024  Signed, Reche GORMAN Finder, NP

## 2024-01-10 ENCOUNTER — Encounter (HOSPITAL_BASED_OUTPATIENT_CLINIC_OR_DEPARTMENT_OTHER): Payer: Self-pay | Admitting: Internal Medicine

## 2024-01-10 DIAGNOSIS — I2510 Atherosclerotic heart disease of native coronary artery without angina pectoris: Secondary | ICD-10-CM

## 2024-01-10 DIAGNOSIS — E782 Mixed hyperlipidemia: Secondary | ICD-10-CM

## 2024-01-12 ENCOUNTER — Encounter (HOSPITAL_BASED_OUTPATIENT_CLINIC_OR_DEPARTMENT_OTHER): Payer: Self-pay | Admitting: Internal Medicine

## 2024-01-12 DIAGNOSIS — I2510 Atherosclerotic heart disease of native coronary artery without angina pectoris: Secondary | ICD-10-CM

## 2024-01-13 ENCOUNTER — Other Ambulatory Visit (HOSPITAL_BASED_OUTPATIENT_CLINIC_OR_DEPARTMENT_OTHER): Payer: Self-pay | Admitting: Internal Medicine

## 2024-01-13 DIAGNOSIS — F17210 Nicotine dependence, cigarettes, uncomplicated: Secondary | ICD-10-CM

## 2024-01-18 ENCOUNTER — Ambulatory Visit (HOSPITAL_BASED_OUTPATIENT_CLINIC_OR_DEPARTMENT_OTHER)
Admission: RE | Admit: 2024-01-18 | Discharge: 2024-01-18 | Disposition: A | Discharge status: Home / Self Care | Source: Ambulatory Visit | Attending: Internal Medicine | Admitting: Internal Medicine

## 2024-01-18 DIAGNOSIS — E042 Nontoxic multinodular goiter: Secondary | ICD-10-CM | POA: Insufficient documentation

## 2024-01-18 DIAGNOSIS — I700 Atherosclerosis of aorta: Secondary | ICD-10-CM | POA: Insufficient documentation

## 2024-01-18 DIAGNOSIS — I7121 Aneurysm of the ascending aorta, without rupture: Secondary | ICD-10-CM | POA: Insufficient documentation

## 2024-01-18 DIAGNOSIS — F17210 Nicotine dependence, cigarettes, uncomplicated: Secondary | ICD-10-CM | POA: Diagnosis present

## 2024-01-20 ENCOUNTER — Telehealth: Payer: Self-pay | Admitting: Cardiology

## 2024-01-20 ENCOUNTER — Encounter: Payer: Self-pay | Admitting: *Deleted

## 2024-01-20 DIAGNOSIS — I2510 Atherosclerotic heart disease of native coronary artery without angina pectoris: Secondary | ICD-10-CM

## 2024-01-20 DIAGNOSIS — I493 Ventricular premature depolarization: Secondary | ICD-10-CM

## 2024-01-20 MED ORDER — METOPROLOL TARTRATE 50 MG PO TABS
50.0000 mg | ORAL_TABLET | Freq: Two times a day (BID) | ORAL | 3 refills | Status: DC
Start: 2024-01-20 — End: 2024-02-07

## 2024-01-20 NOTE — Telephone Encounter (Signed)
 Due to history of bradycardia (low heart rate) would avoid heart rate lowering therapies. Ensure hydrating prior to exercise. Okay if with exercise heart rate up to 140 , just if he notes heart rate at 140 or more would pull back a little bit on exercise (reduce speed or intensity). Diastolic BP of 55-60 bpm is okay, we want it to be less than 80.   Often position changes cause drop in BP and lightheadedness: prevent this by making position changes slowly, staying well hydrated.   His CT scan he had done will not specifically image the coronary arteries. If he is interested in further evaluation, would recommend cardiac CTA. Okay to order if he is interested.   Danika Kluender S Quill Grinder, NP

## 2024-01-20 NOTE — Telephone Encounter (Signed)
 Left message for pt to call.

## 2024-01-20 NOTE — Telephone Encounter (Signed)
 STAT if HR is under 50 or over 120  (normal HR is 60-100 beats per minute)  What is your heart rate?  Has been up to the 140's with normal, routine exercise which is unusual because it's normally around the 120's at the highest.  Do you have a log of your heart rate readings (document readings)?  Most recent - 119/63 62 + 98 o2  Do you have any other symptoms? Feels dizzy when propping up legs

## 2024-01-20 NOTE — Telephone Encounter (Signed)
 Spoke with pt, recommendations given. He is interested in a coronary CTA. Order placed and instructions sent to patient via my chart.

## 2024-01-20 NOTE — Telephone Encounter (Signed)
 HR goes up to about 140 during exercise; used to only go up to 110-120's. 220- 78 (age)= 142 (thought his hr should not go over 142 due to age, etc)  He feels he is unable to get through his exercises as well/as easily due to hr getting up high and not feeling like he can complete work out.   Also worried about the lower number of BP has decreased to 55-60's and it used to be in the 70's.   Also noticed that he gets dizzy after propping feet up for a bit, then getting up- sometimes will feel a little dizzy.   No shortness of breath, no pain.   He is waiting for his results of his CT chest. But wonders if he also needs further testing to see if there are some blockages, etc; he is a little worried due to these changes and his family history of heart problems.   Informed him that I will send his questions to his provider and we will f/u with recommendations.  He verbalized understanding.

## 2024-01-21 ENCOUNTER — Other Ambulatory Visit (HOSPITAL_COMMUNITY): Payer: Self-pay | Admitting: Emergency Medicine

## 2024-01-21 ENCOUNTER — Telehealth (HOSPITAL_COMMUNITY): Payer: Self-pay | Admitting: Emergency Medicine

## 2024-01-21 ENCOUNTER — Encounter (HOSPITAL_COMMUNITY): Payer: Self-pay | Admitting: Internal Medicine

## 2024-01-21 NOTE — Telephone Encounter (Signed)
 Reaching out to patient to offer assistance regarding upcoming cardiac imaging study; pt verbalizes understanding of appt date/time, parking situation and where to check in, pre-test NPO status and medications ordered, and verified current allergies; name and call back number provided for further questions should they arise Rockwell Alexandria RN Navigator Cardiac Imaging [facility] [phone] office [phone] cell

## 2024-01-24 ENCOUNTER — Ambulatory Visit (HOSPITAL_BASED_OUTPATIENT_CLINIC_OR_DEPARTMENT_OTHER): Payer: Self-pay | Admitting: Family

## 2024-01-24 ENCOUNTER — Ambulatory Visit (HOSPITAL_COMMUNITY)
Admission: RE | Admit: 2024-01-24 | Discharge: 2024-01-24 | Disposition: A | Discharge status: Home / Self Care | Source: Ambulatory Visit | Attending: Internal Medicine | Admitting: Internal Medicine

## 2024-01-24 ENCOUNTER — Other Ambulatory Visit: Payer: Self-pay | Admitting: Internal Medicine

## 2024-01-24 ENCOUNTER — Ambulatory Visit (HOSPITAL_COMMUNITY)
Admission: RE | Admit: 2024-01-24 | Discharge: 2024-01-24 | Disposition: A | Discharge status: Home / Self Care | Source: Ambulatory Visit | Attending: Family | Admitting: Family

## 2024-01-24 DIAGNOSIS — I2510 Atherosclerotic heart disease of native coronary artery without angina pectoris: Secondary | ICD-10-CM | POA: Diagnosis not present

## 2024-01-24 DIAGNOSIS — R931 Abnormal findings on diagnostic imaging of heart and coronary circulation: Secondary | ICD-10-CM

## 2024-01-24 DIAGNOSIS — I493 Ventricular premature depolarization: Secondary | ICD-10-CM | POA: Diagnosis not present

## 2024-01-24 DIAGNOSIS — I517 Cardiomegaly: Secondary | ICD-10-CM | POA: Insufficient documentation

## 2024-01-24 MED ORDER — IOHEXOL 350 MG/ML SOLN
100.0000 mL | Freq: Once | INTRAVENOUS | Status: AC | PRN
  Administered 2024-01-24 (×2): 100 mL via INTRAVENOUS

## 2024-01-24 MED ORDER — NITROGLYCERIN 0.4 MG SL SUBL
0.8000 mg | SUBLINGUAL_TABLET | Freq: Once | SUBLINGUAL | Status: AC
  Administered 2024-01-24 (×2): 0.8 mg via SUBLINGUAL

## 2024-01-28 NOTE — Telephone Encounter (Signed)
Patient called to follow-up on his test results. 

## 2024-02-04 DIAGNOSIS — D649 Anemia, unspecified: Secondary | ICD-10-CM | POA: Diagnosis not present

## 2024-02-04 LAB — CBC
Hematocrit: 29.4 % — ABNORMAL LOW (ref 37.5–51.0)
Hemoglobin: 9.1 g/dL — ABNORMAL LOW (ref 13.0–17.7)
MCH: 27.6 pg (ref 26.6–33.0)
MCHC: 31 g/dL — ABNORMAL LOW (ref 31.5–35.7)
MCV: 89 fL (ref 79–97)
Platelets: 247 x10E3/uL (ref 150–450)
RBC: 3.3 x10E6/uL — ABNORMAL LOW (ref 4.14–5.80)
RDW: 13.6 % (ref 11.6–15.4)
WBC: 9.8 x10E3/uL (ref 3.4–10.8)

## 2024-02-07 ENCOUNTER — Ambulatory Visit (HOSPITAL_BASED_OUTPATIENT_CLINIC_OR_DEPARTMENT_OTHER): Payer: Self-pay | Admitting: Family

## 2024-02-09 ENCOUNTER — Ambulatory Visit (INDEPENDENT_AMBULATORY_CARE_PROVIDER_SITE_OTHER): Admitting: Internal Medicine

## 2024-02-09 ENCOUNTER — Other Ambulatory Visit (INDEPENDENT_AMBULATORY_CARE_PROVIDER_SITE_OTHER)

## 2024-02-09 ENCOUNTER — Encounter: Payer: Self-pay | Admitting: Internal Medicine

## 2024-02-09 ENCOUNTER — Telehealth: Payer: Self-pay | Admitting: Internal Medicine

## 2024-02-09 VITALS — BP 110/70 | HR 61 | Ht 72.0 in | Wt 190.0 lb

## 2024-02-09 DIAGNOSIS — Z860100 Personal history of colon polyps, unspecified: Secondary | ICD-10-CM | POA: Diagnosis not present

## 2024-02-09 DIAGNOSIS — D649 Anemia, unspecified: Secondary | ICD-10-CM

## 2024-02-09 DIAGNOSIS — K5520 Angiodysplasia of colon without hemorrhage: Secondary | ICD-10-CM | POA: Diagnosis not present

## 2024-02-09 LAB — VITAMIN B12: Vitamin B-12: >1500 pg/mL — ABNORMAL HIGH (ref 211–911)

## 2024-02-09 LAB — FOLATE: Folate: >22.9 ng/mL (ref >5.9)

## 2024-02-09 MED ORDER — METOCLOPRAMIDE HCL 10 MG PO TABS: 10.0000 mg | ORAL_TABLET | ORAL | 0 refills | Status: DC

## 2024-02-09 NOTE — Patient Instructions (Signed)
 You have been scheduled for an endoscopy and colonoscopy. Please follow the written instructions given to you at your visit today.  If you use inhalers (even only as needed), please bring them with you on the day of your procedure.  DO NOT TAKE 7 DAYS PRIOR TO TEST- Trulicity (dulaglutide) Ozempic, Wegovy (semaglutide) Mounjaro (tirzepatide) Bydureon Bcise (exanatide extended release)  DO NOT TAKE 1 DAY PRIOR TO YOUR TEST Rybelsus (semaglutide) Adlyxin (lixisenatide) Victoza (liraglutide) Byetta (exanatide) ___________________________________________________________________________  Your provider has requested that you go to the basement level for lab work before leaving today. Press B on the elevator. The lab is located at the first door on the left as you exit the elevator.  I appreciate the opportunity to care for you. Lupita Commander, MD, [facility]

## 2024-02-09 NOTE — Progress Notes (Signed)
 Cory Walls 78 y.o. [birth year] [id number]  Assessment & Plan:   Encounter Diagnoses  Name Primary?   Normocytic anemia Yes   Cecal angiodysplasia    History of colonic polyps    Cause of anemia not clear.  He could have been having slow blood loss from the cecal AVM (question other AVMs), and eventually depleted iron and became anemic.  However MCV is normal.  Will evaluate with labs as below. Go ahead and plan for EGD and colonoscopy to evaluate the anemia and ablate the AVM.  Procedures to be scheduled at the hospital.  Small chance we could cancel these depending upon the labs.  If he is iron deficient need to consider the possibility of celiac disease though I think it would be more likely chronic blood loss from the AVM.  If not iron deficient or B12 or folate deficient, hematology referral.  He has vomiting problems with standard prep so we have a customized prep using Linzess, Senokot and Reglan  that works well.  Orders Placed This Encounter  Procedures   Procedural/ Surgical Case Request: COLONOSCOPY, EGD (ESOPHAGOGASTRODUODENOSCOPY)   Vitamin B12   Folate   Iron, TIBC and Ferritin Panel   Ambulatory referral to Gastroenterology    Note the patient was widowed earlier this year and he was in the process of trying to sell his house and then moved to [place] to be near his daughter.  He is putting that process on hold until he sorts out this health issue.   Lab Results  Component Value Date   VITAMINB12 >1500 (H) 02/09/2024    Lab Results  Component Value Date   FOLATE >22.9 02/09/2024     CC: [name] MATSU, MD    Subjective:   Chief Complaint: Anemia  HPI 78 year old man with a history of colon polyp (22 adenomas lifetime, negative genetics evaluation) last seen for colonoscopy 08/20/2023 with 6 adenomas, and a cecal angiodysplastic lesion that was not bleeding.  ED visit 01/03/2024 for lightheadedness left chest pain with pressure associated  with exertion initially x 2 hours some shortness of breath.  Saw cardiology the next day.  The ER workup showed negative troponin and no ischemic changes on EKG.  Cardiology noted he was anemic and did a follow-up CBC and as it was lower referred him here due to declining hemoglobin/anemia.  He also had a CT coronary with flow evaluation that was without hemodynamically significant stenosis on 01/24/2024.  He has not had any rectal bleeding or melena.  He did start iron tablet and stool likely a little bit darker.  He has been on B12 for the past year.  No diagnosis of B12 deficiency he just started it.  No dysphagia abdominal pain unintentional weight loss early satiety.  No history of anemia.  He is not a blood donor.  Wt Readings from Last 3 Encounters:  02/09/24 190 lb (86.2 kg)  01/04/24 186 lb (84.4 kg)  09/27/23 185 lb (83.9 kg)        Latest Ref Rng & Units 02/04/2024   10:21 AM 01/03/2024    6:08 PM 03/20/2023    5:36 AM  CBC  WBC 3.4 - 10.8 x10E3/uL 9.8  11.0  9.3   Hemoglobin 13.0 - 17.7 g/dL 9.1  88.6  85.4   Hematocrit 37.5 - 51.0 % 29.4  33.6  42.3   Platelets 150 - 450 x10E3/uL 247  220  172      Colonoscopy 08/20/23  - Six diminutive polyps  in the transverse colon, at the hepatic flexure and in the cecum, removed with a cold snare. Resected and retrieved. - End-to-end colo-colonic anastomosis, characterized by healthy appearing mucosa. Hirschprung's surgery as child - A single non-bleeding colonic angiodysplastic lesion. - this will make hemoccults + - would not do those - The examination was otherwise normal on direct and retroflexion views. - Personal history of colonic polyps. 2006 - 6 adenomas (also had polyps 2001 approx, in CT) 05/25/2012 4 diminutive polyps - all tubular adenomas 05/21/2015 2 diminutive polyps ssp and tub adenoma - 07/30/2020 6 diminutive polyps all adenomas Polyps were adenomas  Allergies  Allergen Reactions   Penicillins Other (See Comments)     REACTION: as child  Has patient had a PCN reaction causing immediate rash, facial/tongue/throat swelling, SOB or lightheadedness with hypotension: n/a  Has patient had a PCN reaction causing severe rash involving mucus membranes or skin necrosis: n/a  Has patient had a PCN reaction that required hospitalization: n/a  Has patient had a PCN reaction occurring within the last 10 years: n/a   Moviprep [Peg-Kcl-Nacl-Nasulf-Na Asc-C] Nausea And Vomiting   Current Meds  Medication Sig   allopurinol (ZYLOPRIM) 100 MG tablet Take 100 mg by mouth every evening.   aspirin 81 MG tablet Take 81 mg by mouth daily.   cholecalciferol (VITAMIN D3) 25 MCG (1000 UNIT) tablet Take 2,000 Units by mouth daily.   ferrous sulfate 324 MG TBEC Take 324 mg by mouth daily at 6 (six) AM.   fish oil-omega-3 fatty acids 1000 MG capsule Take 1 g by mouth 2 (two) times daily.   Glucosamine-Chondroit-Vit C-Mn (GLUCOSAMINE CHONDR 1500 COMPLX) CAPS Take 2 capsules by mouth daily.   losartan (COZAAR) 25 MG tablet Take 12.5 mg by mouth every evening.   Magnesium 250 MG TABS Take 250 mg by mouth daily.   metFORMIN (GLUCOPHAGE-XR) 500 MG 24 hr tablet Take 500 mg by mouth every evening.   metoCLOPramide  (REGLAN ) 10 MG tablet Take 1 tablet (10 mg total) by mouth as directed.   Multiple Vitamins-Minerals (OCUVITE ADULT 50+) CAPS Take 1 capsule by mouth daily.   MULTIPLE VITAMINS-MINERALS PO Take 1 tablet by mouth daily.   OVER THE COUNTER MEDICATION Apply 1 application topically daily as needed (arthritis pain). Australian Dream Cream   pravastatin (PRAVACHOL) 40 MG tablet Take 40 mg by mouth every evening.   SUPER B COMPLEX/C PO    vitamin C (ASCORBIC ACID) 500 MG tablet Take 1,000 mg by mouth daily.   vitamin E 400 UNIT capsule Take 400 Units by mouth daily.   Past Medical History:  Diagnosis Date   Arthritis    hands    Colon polyps    Diabetes mellitus without complication (HCC)    type II   History of blood  transfusion    age 50   History of colon polyps    History of kidney stones    Hyperlipidemia    Nausea and vomiting - with colonoscopy preps 05/21/2015   Palpitations    Past Surgical History:  Procedure Laterality Date   APPENDECTOMY     age 89 or 6   CHOLECYSTECTOMY     COLONOSCOPY     EXTRACORPOREAL SHOCK WAVE LITHOTRIPSY Right 07/27/2022   Procedure: EXTRACORPOREAL SHOCK WAVE LITHOTRIPSY (ESWL);  Surgeon: Selma Donnice SAUNDERS, MD;  Location: [facility];  Service: Urology;  Laterality: Right;   EXTRACORPOREAL SHOCK WAVE LITHOTRIPSY Left 08/07/2022   Procedure: EXTRACORPOREAL SHOCK WAVE LITHOTRIPSY (ESWL);  Surgeon:  Rosalind Zachary NOVAK, MD;  Location: [facility];  Service: Urology;  Laterality: Left;   HERNIA REPAIR     age 58 or 7   Hirschsprung's resection  1961   REVERSE SHOULDER ARTHROPLASTY Left 06/17/2021   Procedure: REVERSE SHOULDER ARTHROPLASTY;  Surgeon: Josefina Chew, MD;  Location: [facility];  Service: Orthopedics;  Laterality: Left;   surgery for adhesion  1961   Social History   Social History Narrative   Widowed   2 daughters   retired   family history includes Heart disease in his father.   Review of Systems As above  Objective:   Physical Exam @BP  110/70   Pulse 61   Ht 6' (1.829 m)   Wt 190 lb (86.2 kg)   BMI 25.77 kg/m @  General:  NAD Eyes:   anicteric Lungs:  clear Heart::  S1S2 no rubs, murmurs or gallops Abdomen:  soft and nontender, BS+ Ext:   no edema, cyanosis or clubbing    Data Reviewed:  See HPI I have reviewed ED visit and cardiology notes and labs in addition to past GI history

## 2024-02-09 NOTE — Telephone Encounter (Signed)
 Inbound call from patient stating he would like to be referred to Dr. Lonn at [facility] for hematology depending on lab work results from today. Please advise, thank you

## 2024-02-10 ENCOUNTER — Ambulatory Visit: Payer: Self-pay | Admitting: Internal Medicine

## 2024-02-10 LAB — IRON,TIBC AND FERRITIN PANEL
%SAT: 28 % (ref 20–48)
Ferritin: 8 ng/mL — ABNORMAL LOW (ref 24–380)
Iron: 136 ug/dL (ref 50–180)
TIBC: 478 ug/dL — ABNORMAL HIGH (ref 250–425)

## 2024-02-10 NOTE — Telephone Encounter (Signed)
 Spoke to patient.  He has iron deficiency.  Probably from the cecal AVM.  He does not need a hematology referral.  He will continue iron supplementation and EGD and colonoscopy will be performed in October at [facility].

## 2024-02-21 DIAGNOSIS — R3911 Hesitancy of micturition: Secondary | ICD-10-CM | POA: Diagnosis not present

## 2024-02-21 DIAGNOSIS — N401 Enlarged prostate with lower urinary tract symptoms: Secondary | ICD-10-CM | POA: Diagnosis not present

## 2024-02-21 DIAGNOSIS — N200 Calculus of kidney: Secondary | ICD-10-CM | POA: Diagnosis not present

## 2024-02-24 DIAGNOSIS — E119 Type 2 diabetes mellitus without complications: Secondary | ICD-10-CM | POA: Diagnosis not present

## 2024-02-24 DIAGNOSIS — H25813 Combined forms of age-related cataract, bilateral: Secondary | ICD-10-CM | POA: Diagnosis not present

## 2024-02-24 DIAGNOSIS — H43812 Vitreous degeneration, left eye: Secondary | ICD-10-CM | POA: Diagnosis not present

## 2024-02-24 DIAGNOSIS — H353212 Exudative age-related macular degeneration, right eye, with inactive choroidal neovascularization: Secondary | ICD-10-CM | POA: Diagnosis not present

## 2024-02-24 DIAGNOSIS — H353121 Nonexudative age-related macular degeneration, left eye, early dry stage: Secondary | ICD-10-CM | POA: Diagnosis not present

## 2024-03-07 DIAGNOSIS — E1129 Type 2 diabetes mellitus with other diabetic kidney complication: Secondary | ICD-10-CM | POA: Diagnosis not present

## 2024-03-07 DIAGNOSIS — E782 Mixed hyperlipidemia: Secondary | ICD-10-CM | POA: Diagnosis not present

## 2024-03-07 DIAGNOSIS — N401 Enlarged prostate with lower urinary tract symptoms: Secondary | ICD-10-CM | POA: Diagnosis not present

## 2024-03-07 DIAGNOSIS — Z23 Encounter for immunization: Secondary | ICD-10-CM | POA: Diagnosis not present

## 2024-03-07 DIAGNOSIS — I10 Essential (primary) hypertension: Secondary | ICD-10-CM | POA: Diagnosis not present

## 2024-03-07 DIAGNOSIS — I2510 Atherosclerotic heart disease of native coronary artery without angina pectoris: Secondary | ICD-10-CM | POA: Diagnosis not present

## 2024-03-07 DIAGNOSIS — D649 Anemia, unspecified: Secondary | ICD-10-CM | POA: Diagnosis not present

## 2024-03-07 DIAGNOSIS — R809 Proteinuria, unspecified: Secondary | ICD-10-CM | POA: Diagnosis not present

## 2024-03-23 ENCOUNTER — Encounter: Payer: Self-pay | Admitting: Podiatry

## 2024-03-23 ENCOUNTER — Ambulatory Visit

## 2024-03-23 ENCOUNTER — Ambulatory Visit (INDEPENDENT_AMBULATORY_CARE_PROVIDER_SITE_OTHER): Admitting: Podiatry

## 2024-03-23 DIAGNOSIS — D3610 Benign neoplasm of peripheral nerves and autonomic nervous system, unspecified: Secondary | ICD-10-CM

## 2024-03-23 DIAGNOSIS — M779 Enthesopathy, unspecified: Secondary | ICD-10-CM

## 2024-03-23 DIAGNOSIS — I999 Unspecified disorder of circulatory system: Secondary | ICD-10-CM

## 2024-03-23 NOTE — Progress Notes (Signed)
 Subjective:   Patient ID: Cory Walls, male   DOB: 78 y.o.   MRN: [id number]   HPI Patient states he has been getting some numbness and tingling in his right forefoot over the last 3 to 4 months and was concerned.  He is a diabetic under excellent control with his last A1c being 5.8   ROS      Objective:  Physical Exam  Neurovascular status intact mild discomfort forefoot right and I did note when I check circulatory status that the left appears normal but the right did seem to have some reduction of the pulses PT DP.  Upon aggressive questioning I did not note claudication symptomatology     Assessment:  Cannot rule out mild vascular disease right but no indications of advanced disease no indications of other pathology     Plan:  H&P reviewed I went ahead today educated him thoroughly on diabetes on vascular disease and testing.  At this point with an absence of symptoms and him being so active organ to hold off but if any changes were to occur we will order ABIs or other vascular studies.  All questions answered today

## 2024-03-29 ENCOUNTER — Telehealth: Payer: Self-pay | Admitting: Gastroenterology

## 2024-03-29 DIAGNOSIS — R0981 Nasal congestion: Secondary | ICD-10-CM | POA: Diagnosis not present

## 2024-03-29 DIAGNOSIS — J019 Acute sinusitis, unspecified: Secondary | ICD-10-CM | POA: Diagnosis not present

## 2024-03-29 DIAGNOSIS — R6883 Chills (without fever): Secondary | ICD-10-CM | POA: Diagnosis not present

## 2024-03-29 DIAGNOSIS — R051 Acute cough: Secondary | ICD-10-CM | POA: Diagnosis not present

## 2024-03-29 DIAGNOSIS — I2510 Atherosclerotic heart disease of native coronary artery without angina pectoris: Secondary | ICD-10-CM | POA: Diagnosis not present

## 2024-03-29 DIAGNOSIS — Z1152 Encounter for screening for COVID-19: Secondary | ICD-10-CM | POA: Diagnosis not present

## 2024-03-29 DIAGNOSIS — J3489 Other specified disorders of nose and nasal sinuses: Secondary | ICD-10-CM | POA: Diagnosis not present

## 2024-03-29 DIAGNOSIS — E782 Mixed hyperlipidemia: Secondary | ICD-10-CM | POA: Diagnosis not present

## 2024-03-29 DIAGNOSIS — D649 Anemia, unspecified: Secondary | ICD-10-CM | POA: Diagnosis not present

## 2024-03-29 DIAGNOSIS — E1129 Type 2 diabetes mellitus with other diabetic kidney complication: Secondary | ICD-10-CM | POA: Diagnosis not present

## 2024-03-29 DIAGNOSIS — I10 Essential (primary) hypertension: Secondary | ICD-10-CM | POA: Diagnosis not present

## 2024-03-29 NOTE — Telephone Encounter (Addendum)
 Procedure:Colonoscopy/Endoscopy Procedure date: 04/06/24  Procedure location: [facility] Arrival Time: 7:30 am Spoke with the patient Y/N: Yes Any prep concerns? No  Has the patient obtained the prep from the pharmacy ? Yes Do you have a care partner and transportation: Yes Any additional concerns? No

## 2024-03-30 ENCOUNTER — Encounter (HOSPITAL_COMMUNITY): Payer: Self-pay | Admitting: Internal Medicine

## 2024-04-05 NOTE — H&P (Signed)
 [facility] History and Physical   Primary Care Physician:  [name] MATSU, MD   Reason for Procedure:   Iron deficiency anemia due to chronic blood loss, known cecal AVM  Plan:    EGD and colonoscopy     HPI: Cory Walls is a 78 y.o. male here to likely treat a known cecal AVM w/ APC and look for other causes of iron deficiency anemia.      Latest Ref Rng & Units 02/04/2024   10:21 AM 01/03/2024    6:08 PM 03/20/2023    5:36 AM  CBC  WBC 3.4 - 10.8 x10E3/uL 9.8  11.0  9.3   Hemoglobin 13.0 - 17.7 g/dL 9.1  88.6  85.4   Hematocrit 37.5 - 51.0 % 29.4  33.6  42.3   Platelets 150 - 450 x10E3/uL 247  220  172     Lab Results  Component Value Date   IRON 136 02/09/2024   TIBC 478 (H) 02/09/2024   FERRITIN 8 (L) 02/09/2024     Past Medical History:  Diagnosis Date   Arthritis    hands    Colon polyps    Diabetes mellitus without complication (HCC)    type II   History of blood transfusion    age 51   History of colon polyps    History of kidney stones    Hyperlipidemia    Nausea and vomiting - with colonoscopy preps 05/21/2015   Palpitations     Past Surgical History:  Procedure Laterality Date   APPENDECTOMY     age 71 or 6   CHOLECYSTECTOMY     COLONOSCOPY     EXTRACORPOREAL SHOCK WAVE LITHOTRIPSY Right 07/27/2022   Procedure: EXTRACORPOREAL SHOCK WAVE LITHOTRIPSY (ESWL);  Surgeon: Selma Donnice SAUNDERS, MD;  Location: [facility];  Service: Urology;  Laterality: Right;   EXTRACORPOREAL SHOCK WAVE LITHOTRIPSY Left 08/07/2022   Procedure: EXTRACORPOREAL SHOCK WAVE LITHOTRIPSY (ESWL);  Surgeon: Rosalind Zachary NOVAK, MD;  Location: [facility];  Service: Urology;  Laterality: Left;   HERNIA REPAIR     age 76 or 7   Hirschsprung's resection  1961   REVERSE SHOULDER ARTHROPLASTY Left 06/17/2021   Procedure: REVERSE SHOULDER ARTHROPLASTY;  Surgeon: Josefina Chew, MD;  Location: [facility];  Service: Orthopedics;  Laterality:  Left;   surgery for adhesion  1961     No current facility-administered medications for this encounter.   Current Outpatient Medications  Medication Sig Dispense Refill   allopurinol (ZYLOPRIM) 100 MG tablet Take 100 mg by mouth every evening.     aspirin 81 MG tablet Take 81 mg by mouth daily.     cholecalciferol (VITAMIN D3) 25 MCG (1000 UNIT) tablet Take 2,000 Units by mouth daily.     ferrous sulfate 324 MG TBEC Take 324 mg by mouth daily at 6 (six) AM.     fish oil-omega-3 fatty acids 1000 MG capsule Take 1 g by mouth 2 (two) times daily.     Glucosamine-Chondroit-Vit C-Mn (GLUCOSAMINE CHONDR 1500 COMPLX) CAPS Take 2 capsules by mouth daily.     losartan (COZAAR) 25 MG tablet Take 12.5 mg by mouth every evening.     Magnesium 250 MG TABS Take 250 mg by mouth daily.     metFORMIN (GLUCOPHAGE-XR) 500 MG 24 hr tablet Take 500 mg by mouth every evening.     metoCLOPramide  (REGLAN ) 10 MG tablet Take 1 tablet (10 mg total) by mouth as directed. 2 tablet 0  Multiple Vitamins-Minerals (OCUVITE ADULT 50+) CAPS Take 1 capsule by mouth daily.     MULTIPLE VITAMINS-MINERALS PO Take 1 tablet by mouth daily.     OVER THE COUNTER MEDICATION Apply 1 application topically daily as needed (arthritis pain). Australian Dream Cream     pravastatin (PRAVACHOL) 40 MG tablet Take 40 mg by mouth every evening.     SUPER B COMPLEX/C PO      vitamin C (ASCORBIC ACID) 500 MG tablet Take 1,000 mg by mouth daily.     vitamin E 400 UNIT capsule Take 400 Units by mouth daily.      Allergies as of 02/09/2024 - Review Complete 02/09/2024  Allergen Reaction Noted   Penicillins Other (See Comments) 05/29/2009   Moviprep [peg-kcl-[id number]-na asc-c] Nausea And Vomiting 05/22/2015    Family History  Problem Relation Age of Onset   Heart disease Father    Colon cancer Neg Hx    Stomach cancer Neg Hx    Colon polyps Neg Hx    Diabetes Neg Hx    Kidney disease Neg Hx    Rectal cancer Neg Hx     Social  History   Socioeconomic History   Marital status: Widowed    Spouse name: Not on file   Number of children: 2   Years of education: Not on file   Highest education level: Not on file  Occupational History   Occupation: Retired  Tobacco Use   Smoking status: Some Days    Current packs/day: 0.25    Types: Cigarettes, Cigars   Smokeless tobacco: Never   Tobacco comments:    smokes 2 to 3 cigarettes daily  Vaping Use   Vaping status: Never Used  Substance and Sexual Activity   Alcohol  use: Not Currently    Alcohol /week: 1.0 standard drink of alcohol     Types: 1 Glasses of wine per week    Comment: rare   Drug use: No   Sexual activity: Not on file  Other Topics Concern   Not on file  Social History Narrative   Widowed   2 daughters   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  Physical Exam: Vital signs There were no vitals taken for this visit.  General:   Alert,  Well-developed, well-nourished, pleasant and cooperative in NAD Lungs:  Clear throughout to auscultation.   Heart:  Regular rate and rhythm; no murmurs, clicks, rubs,  or gallops. Abdomen:  Soft, nontender and nondistended. Normal bowel sounds.   Neuro/Psych:  Alert and cooperative. Normal mood and affect. A and O x 3   @Abdelrahman Nair  CHARLENA Commander, MD, [facility] [phone] (pager) 04/05/2024 8:54 PM@

## 2024-04-06 ENCOUNTER — Ambulatory Visit (HOSPITAL_COMMUNITY)
Admission: RE | Admit: 2024-04-06 | Discharge: 2024-04-06 | Disposition: A | Discharge status: Home / Self Care | Attending: Internal Medicine | Admitting: Internal Medicine

## 2024-04-06 ENCOUNTER — Encounter (HOSPITAL_COMMUNITY): Payer: Self-pay | Admitting: Internal Medicine

## 2024-04-06 ENCOUNTER — Other Ambulatory Visit: Payer: Self-pay

## 2024-04-06 ENCOUNTER — Encounter (HOSPITAL_COMMUNITY): Admission: RE | Disposition: A | Payer: Self-pay | Source: Home / Self Care | Attending: Internal Medicine

## 2024-04-06 ENCOUNTER — Ambulatory Visit (HOSPITAL_BASED_OUTPATIENT_CLINIC_OR_DEPARTMENT_OTHER): Payer: Self-pay | Admitting: Certified Registered"

## 2024-04-06 ENCOUNTER — Encounter (HOSPITAL_COMMUNITY): Payer: Self-pay | Admitting: Certified Registered"

## 2024-04-06 DIAGNOSIS — K5520 Angiodysplasia of colon without hemorrhage: Secondary | ICD-10-CM | POA: Insufficient documentation

## 2024-04-06 DIAGNOSIS — K635 Polyp of colon: Secondary | ICD-10-CM | POA: Diagnosis not present

## 2024-04-06 DIAGNOSIS — Z860102 Personal history of hyperplastic colon polyps: Secondary | ICD-10-CM | POA: Insufficient documentation

## 2024-04-06 DIAGNOSIS — K2980 Duodenitis without bleeding: Secondary | ICD-10-CM | POA: Diagnosis not present

## 2024-04-06 DIAGNOSIS — E119 Type 2 diabetes mellitus without complications: Secondary | ICD-10-CM | POA: Diagnosis not present

## 2024-04-06 DIAGNOSIS — K2950 Unspecified chronic gastritis without bleeding: Secondary | ICD-10-CM | POA: Diagnosis not present

## 2024-04-06 DIAGNOSIS — D124 Benign neoplasm of descending colon: Secondary | ICD-10-CM | POA: Diagnosis not present

## 2024-04-06 DIAGNOSIS — D500 Iron deficiency anemia secondary to blood loss (chronic): Secondary | ICD-10-CM | POA: Insufficient documentation

## 2024-04-06 DIAGNOSIS — D122 Benign neoplasm of ascending colon: Secondary | ICD-10-CM | POA: Insufficient documentation

## 2024-04-06 DIAGNOSIS — F17210 Nicotine dependence, cigarettes, uncomplicated: Secondary | ICD-10-CM | POA: Insufficient documentation

## 2024-04-06 DIAGNOSIS — B9681 Helicobacter pylori [H. pylori] as the cause of diseases classified elsewhere: Secondary | ICD-10-CM | POA: Diagnosis not present

## 2024-04-06 DIAGNOSIS — Z860100 Personal history of colon polyps, unspecified: Secondary | ICD-10-CM | POA: Diagnosis not present

## 2024-04-06 DIAGNOSIS — Z860101 Personal history of adenomatous and serrated colon polyps: Secondary | ICD-10-CM | POA: Diagnosis not present

## 2024-04-06 DIAGNOSIS — K3189 Other diseases of stomach and duodenum: Secondary | ICD-10-CM

## 2024-04-06 DIAGNOSIS — Z980 Intestinal bypass and anastomosis status: Secondary | ICD-10-CM | POA: Insufficient documentation

## 2024-04-06 DIAGNOSIS — D649 Anemia, unspecified: Secondary | ICD-10-CM

## 2024-04-06 HISTORY — PX: ESOPHAGOGASTRODUODENOSCOPY: SHX5428

## 2024-04-06 HISTORY — PX: COLONOSCOPY: SHX5424

## 2024-04-06 LAB — FERRITIN: Ferritin: 37 ng/mL (ref 24–336)

## 2024-04-06 LAB — POCT I-STAT, CHEM 8
BUN: 17 mg/dL (ref 8–23)
Calcium, Ion: 1.12 mmol/L — ABNORMAL LOW (ref 1.15–1.40)
Chloride: 100 mmol/L (ref 98–111)
Creatinine, Ser: 1 mg/dL (ref 0.61–1.24)
Glucose, Bld: 159 mg/dL — ABNORMAL HIGH (ref 70–99)
HCT: 50 % (ref 39.0–52.0)
Hemoglobin: 17 g/dL (ref 13.0–17.0)
Potassium: 4.2 mmol/L (ref 3.5–5.1)
Sodium: 137 mmol/L (ref 135–145)
TCO2: 24 mmol/L (ref 22–32)

## 2024-04-06 LAB — CBC
HCT: 43.8 % (ref 39.0–52.0)
Hemoglobin: 14.2 g/dL (ref 13.0–17.0)
MCH: 27.4 pg (ref 26.0–34.0)
MCHC: 32.4 g/dL (ref 30.0–36.0)
MCV: 84.4 fL (ref 80.0–100.0)
Platelets: 225 K/uL (ref 150–400)
RBC: 5.19 MIL/uL (ref 4.22–5.81)
RDW: 18 % — ABNORMAL HIGH (ref 11.5–15.5)
WBC: 13.2 K/uL — ABNORMAL HIGH (ref 4.0–10.5)
nRBC: 0 % (ref 0.0–0.2)

## 2024-04-06 SURGERY — COLONOSCOPY
Anesthesia: Monitor Anesthesia Care

## 2024-04-06 MED ORDER — PROPOFOL 500 MG/50ML IV EMUL
INTRAVENOUS | Status: DC | PRN
Start: 2024-04-06 — End: 2024-04-06
  Administered 2024-04-06: 125 ug/kg/min via INTRAVENOUS

## 2024-04-06 MED ORDER — SODIUM CHLORIDE 0.9 % IV SOLN: INTRAVENOUS | Status: DC

## 2024-04-06 MED ORDER — PROPOFOL 10 MG/ML IV BOLUS
INTRAVENOUS | Status: DC | PRN
  Administered 2024-04-06: 10 mg via INTRAVENOUS
  Administered 2024-04-06: 30 mg via INTRAVENOUS

## 2024-04-06 MED ORDER — LIDOCAINE HCL (PF) 2 % IJ SOLN
INTRAMUSCULAR | Status: DC | PRN
  Administered 2024-04-06: 100 mg via INTRADERMAL

## 2024-04-06 MED ORDER — SODIUM CHLORIDE 0.9 % IV SOLN
INTRAVENOUS | Status: AC | PRN
  Administered 2024-04-06: 500 mL via INTRAMUSCULAR

## 2024-04-06 MED ORDER — PROPOFOL 1000 MG/100ML IV EMUL
INTRAVENOUS | Status: AC
  Filled 2024-04-06: qty 100

## 2024-04-06 NOTE — Anesthesia Procedure Notes (Addendum)
 Procedure Name: MAC Date/Time: 04/06/2024 9:27 AM  Performed by: Metta Andrea NOVAK, CRNAPre-anesthesia Checklist: Patient identified, Emergency Drugs available, Suction available, Patient being monitored and Timeout performed Oxygen Delivery Method: Simple face mask Placement Confirmation: positive ETCO2

## 2024-04-06 NOTE — Discharge Instructions (Addendum)
 I ablated the AVM in the colon so you will not leak blood from that anymore.   I found and removed an additional 3 small polyps.  In patients like you with numerous polyps this is common.  They all look benign.  The upper endoscopy exam revealed what looks like chronic gastritis and some acute inflammation in the upper intestine and I biopsied these areas.  Will check blood count and iron level today and let you know about the pathology results when they return.  Continue your current medications.  Avoid taking things like ibuprofen, Naprosyn (Aleve), aspirin for the next 2 weeks.  Acetaminophen  or Tylenol  is fine.  I appreciate the opportunity to care for you. Lupita CHARLENA Commander, MD, FACG  YOU HAD AN ENDOSCOPIC PROCEDURE TODAY: Refer to the procedure report and other information in the discharge instructions given to you for any specific questions about what was found during the examination. If this information does not answer your questions, please call Dr. Darilyn office at [phone] to clarify.   YOU SHOULD EXPECT: Some feelings of bloating in the abdomen. Passage of more gas than usual. Walking can help get rid of the air that was put into your GI tract during the procedure and reduce the bloating. If you had a lower endoscopy (such as a colonoscopy or flexible sigmoidoscopy) you may notice spotting of blood in your stool or on the toilet paper. Some abdominal soreness may be present for a day or two, also.  DIET: Your first meal following the procedure should be a light meal and then it is ok to progress to your normal diet. A half-sandwich or bowl of soup is an example of a good first meal. Heavy or fried foods are harder to digest and may make you feel nauseous or bloated. Drink plenty of fluids but you should avoid alcoholic beverages for 24 hours.   ACTIVITY: Your care partner should take you home directly after the procedure. You should plan to take it easy, moving slowly for the rest  of the day. You can resume normal activity the day after the procedure however YOU SHOULD NOT DRIVE, use power tools, machinery or perform tasks that involve climbing or major physical exertion for 24 hours (because of the sedation medicines used during the test).   SYMPTOMS TO REPORT IMMEDIATELY: A gastroenterologist can be reached at any hour. Please call [phone]  for any of the following symptoms:  Following lower endoscopy (colonoscopy, flexible sigmoidoscopy) Excessive amounts of blood in the stool  Significant tenderness, worsening of abdominal pains  Swelling of the abdomen that is new, acute  Fever of 100 or higher  Following upper endoscopy (EGD, EUS, ERCP, esophageal dilation) Vomiting of blood or coffee ground material  New, significant abdominal pain  New, significant chest pain or pain under the shoulder blades  Painful or persistently difficult swallowing  New shortness of breath  Black, tarry-looking or red, bloody stools  FOLLOW UP:  If any biopsies were taken you will be contacted by phone or by letter within the next 1-3 weeks. Call [phone]  if you have not heard about the biopsies in 3 weeks.  Please also call with any specific questions about appointments or follow up tests.

## 2024-04-06 NOTE — Anesthesia Preprocedure Evaluation (Signed)
 Anesthesia Evaluation  Patient identified by MRN, date of birth, ID band Patient awake    Reviewed: Allergy & Precautions, NPO status , Patient's Chart, lab work & pertinent test results  Airway Mallampati: II  TM Distance: >3 FB Neck ROM: Full    Dental  (+) Teeth Intact, Dental Advisory Given   Pulmonary Current Smoker and Patient abstained from smoking.   breath sounds clear to auscultation       Cardiovascular negative cardio ROS  Rhythm:Regular Rate:Normal     Neuro/Psych negative neurological ROS  negative psych ROS   GI/Hepatic negative GI ROS, Neg liver ROS,,,  Endo/Other  diabetes    Renal/GU negative Renal ROS     Musculoskeletal  (+) Arthritis ,    Abdominal   Peds  Hematology negative hematology ROS (+)   Anesthesia Other Findings   Reproductive/Obstetrics                              Anesthesia Physical Anesthesia Plan  ASA: 2  Anesthesia Plan: MAC   Post-op Pain Management: Minimal or no pain anticipated   Induction: Intravenous  PONV Risk Score and Plan: 0 and Propofol  infusion  Airway Management Planned: Natural Airway  Additional Equipment: None  Intra-op Plan:   Post-operative Plan:   Informed Consent: I have reviewed the patients History and Physical, chart, labs and discussed the procedure including the risks, benefits and alternatives for the proposed anesthesia with the patient or authorized representative who has indicated his/her understanding and acceptance.       Plan Discussed with: CRNA  Anesthesia Plan Comments:         Anesthesia Quick Evaluation

## 2024-04-06 NOTE — Anesthesia Postprocedure Evaluation (Signed)
 Anesthesia Post Note  Patient: Cory Walls  Procedure(s) Performed: COLONOSCOPY EGD (ESOPHAGOGASTRODUODENOSCOPY)     Patient location during evaluation: PACU Anesthesia Type: MAC Level of consciousness: awake and alert Pain management: pain level controlled Vital Signs Assessment: post-procedure vital signs reviewed and stable Respiratory status: spontaneous breathing, nonlabored ventilation, respiratory function stable and patient connected to nasal cannula oxygen Cardiovascular status: stable and blood pressure returned to baseline Postop Assessment: no apparent nausea or vomiting Anesthetic complications: no   No notable events documented.  Last Vitals:  Vitals:   04/06/24 1020 04/06/24 1030  BP: (!) 111/43 (!) 113/57  Pulse: (!) 56 62  Resp: 14 18  Temp:    SpO2: 98% 96%    Last Pain:  Vitals:   04/06/24 1030  TempSrc:   PainSc: 0-No pain                 Cory Walls

## 2024-04-06 NOTE — Op Note (Signed)
 [facility] Patient Name: Cory Walls Procedure Date: 04/06/2024 MRN: [id number] Attending MD: Lupita FORBES Commander , MD, [id number] Date of Birth: Mar 20, 1946 CSN: [id number] Age: 78 Admit Type: Outpatient Procedure:                Upper GI endoscopy Indications:              Iron deficiency anemia secondary to chronic blood                            loss Providers:                Lupita CHARLENA Commander, MD, Darleene Bare, RN, Haskel Chris, Technician Referring MD:              Medicines:                Monitored Anesthesia Care Complications:            No immediate complications. Estimated Blood Loss:     Estimated blood loss was minimal. Procedure:                Pre-Anesthesia Assessment:                           - Prior to the procedure, a History and Physical                            was performed, and patient medications and                            allergies were reviewed. The patient's tolerance of                            previous anesthesia was also reviewed. The risks                            and benefits of the procedure and the sedation                            options and risks were discussed with the patient.                            All questions were answered, and informed consent                            was obtained. Prior Anticoagulants: The patient has                            taken no anticoagulant or antiplatelet agents. ASA                            Grade Assessment: II - A patient with mild systemic  disease. After reviewing the risks and benefits,                            the patient was deemed in satisfactory condition to                            undergo the procedure.                           After obtaining informed consent, the endoscope was                            passed under direct vision. Throughout the                            procedure, the patient's blood  pressure, pulse, and                            oxygen saturations were monitored continuously. The                            [id number]-[id number] ([id number]) Olympus endoscope was introduced                            through the mouth, and advanced to the second part                            of duodenum. The upper GI endoscopy was                            accomplished without difficulty. The patient                            tolerated the procedure well. Scope In: Scope Out: Findings:      Diffuse moderate inflammation characterized by congestion (edema),       erosions, erythema and friability was found in the entire examined       stomach. Biopsies were taken with a cold forceps for histology.       Verification of patient identification for the specimen was done.       Estimated blood loss was minimal.      Localized moderate inflammation characterized by congestion (edema) and       erosions was found in the duodenal bulb. Biopsies were taken with a cold       forceps for histology. Verification of patient identification for the       specimen was done. Estimated blood loss was minimal.      The exam was otherwise without abnormality.      The cardia and gastric fundus were otherwise normal on retroflexion. Impression:               - Mucosal changes suspicious for gastritis.                            Biopsied.                           -  Duodenitis. Biopsied.                           - The examination was otherwise normal. Moderate Sedation:      Not Applicable - Patient had care per Anesthesia. Recommendation:           - Patient has a contact number available for                            emergencies. The signs and symptoms of potential                            delayed complications were discussed with the                            patient. Return to normal activities tomorrow.                            Written discharge instructions were provided to the                             patient.                           - Resume previous diet.                           - Continue present medications.                           - Await pathology results.                           - See the other procedure note for documentation of                            additional recommendations. Procedure Code(s):        --- Professional ---                           [phone], Esophagogastroduodenoscopy, flexible,                            transoral; with biopsy, single or multiple Diagnosis Code(s):        --- Professional ---                           K31.89, Other diseases of stomach and duodenum                           D50.0, Iron deficiency anemia secondary to blood                            loss (chronic) CPT copyright 2022 American Medical Association. All rights reserved. The codes documented in this report are preliminary and upon coder review may  be revised to meet current compliance requirements. Lupita FORBES Commander, MD  04/06/2024 10:13:51 AM This report has been signed electronically. Number of Addenda: 0

## 2024-04-06 NOTE — Transfer of Care (Signed)
 Immediate Anesthesia Transfer of Care Note  Patient: Cory Walls  Procedure(s) Performed: COLONOSCOPY EGD (ESOPHAGOGASTRODUODENOSCOPY)  Patient Location: PACU and Endoscopy Unit  Anesthesia Type:MAC  Level of Consciousness: drowsy and responds to stimulation  Airway & Oxygen Therapy: Patient Spontanous Breathing and Patient connected to face mask oxygen  Post-op Assessment: Report given to RN and Post -op Vital signs reviewed and stable  Post vital signs: Reviewed and stable  Last Vitals:  Vitals Value Taken Time  BP 117/66 04/06/24 10:10  Temp    Pulse 56 04/06/24 10:11  Resp 15 04/06/24 10:11  SpO2 98 % 04/06/24 10:11  Vitals shown include unfiled device data.  Last Pain:  Vitals:   04/06/24 0821  TempSrc: Temporal  PainSc: 0-No pain         Complications: No notable events documented.

## 2024-04-06 NOTE — Op Note (Signed)
 [facility] Patient Name: Cory Walls Procedure Date: 04/06/2024 MRN: [id number] Attending MD: Lupita FORBES Commander , MD, [id number] Date of Birth: 1946/05/10 CSN: [id number] Age: 78 Admit Type: Outpatient Procedure:                Colonoscopy Indications:              Iron deficiency anemia secondary to chronic blood                            loss, Angiodysplasia (of intestine), For therapy of                            angiodysplasia (of intestine) Providers:                Lupita CHARLENA Commander, MD, Darleene Bare, RN, Haskel Chris, Technician Referring MD:              Medicines:                Monitored Anesthesia Care Complications:            No immediate complications. Estimated Blood Loss:     Estimated blood loss was minimal. Procedure:                Pre-Anesthesia Assessment:                           - Prior to the procedure, a History and Physical                            was performed, and patient medications and                            allergies were reviewed. The patient's tolerance of                            previous anesthesia was also reviewed. The risks                            and benefits of the procedure and the sedation                            options and risks were discussed with the patient.                            All questions were answered, and informed consent                            was obtained. Prior Anticoagulants: The patient has                            taken no anticoagulant or antiplatelet agents. ASA  Grade Assessment: II - A patient with mild systemic                            disease. After reviewing the risks and benefits,                            the patient was deemed in satisfactory condition to                            undergo the procedure.                           After obtaining informed consent, the colonoscope                            was  passed under direct vision. Throughout the                            procedure, the patient's blood pressure, pulse, and                            oxygen saturations were monitored continuously. The                            CF-HQ190L ([id number]) Olympus colonoscope was                            introduced through the anus and advanced to the the                            terminal ileum, with identification of the                            appendiceal orifice and IC valve. The colonoscopy                            was performed without difficulty. The patient                            tolerated the procedure well. The quality of the                            bowel preparation was adequate. The terminal ileum,                            ileocecal valve, appendiceal orifice, and rectum                            were photographed. The bowel preparation used was                            Linzess + Senokot (has vomiting with standard  preps) via split dose instruction. Scope In: 9:41:43 AM Scope Out: 10:03:35 AM Scope Withdrawal Time: 0 hours 19 minutes 3 seconds  Total Procedure Duration: 0 hours 21 minutes 52 seconds  Findings:      The perianal and digital rectal examinations were normal. Pertinent       negatives include normal prostate (size, shape, and consistency).      A single angiodysplastic lesion without bleeding was found in the cecum.       Coagulation for bleeding prevention using argon plasma at 0.5       liters/minute and 20 watts was successful. Estimated blood loss: none.      Three sessile polyps were found in the descending colon and ascending       colon. The polyps were 3 to 7 mm in size. These polyps were removed with       a cold snare. Resection and retrieval were complete. Verification of       patient identification for the specimen was done. Estimated blood loss       was minimal.      There was evidence of a prior end-to-end  colo-colonic anastomosis in the       rectum. This was patent.      The exam was otherwise without abnormality on direct and retroflexion       views. Impression:               - A single non-bleeding colonic angiodysplastic                            lesion. Treated with argon plasma coagulation (APC).                           - Three 3 to 7 mm polyps in the descending colon                            and in the ascending colon, removed with a cold                            snare. Resected and retrieved.                           - Patent end-to-end colo-colonic anastomosis.                           - The examination was otherwise normal on direct                            and retroflexion views.                           - Personal history of colonic polyps. 2006 - 6                            adenomas (also had polyps 2001 approx, in [place])                           05/25/2012 4 diminutive polyps - all tubular  adenomas                           05/21/2015 2 diminutive polyps ssp and tub adenoma -                           07/30/2020 6 diminutive polyps all adenomas                           08/20/2023 6 diminutive polyps 5 adenomas and one                            hyperplastic recall 2028 22 lifetime adenomas -                            genetics eval negative Moderate Sedation:      Not Applicable - Patient had care per Anesthesia. Recommendation:           - Patient has a contact number available for                            emergencies. The signs and symptoms of potential                            delayed complications were discussed with the                            patient. Return to normal activities tomorrow.                            Written discharge instructions were provided to the                            patient.                           - Resume previous diet.                           - Continue present medications.                            - No aspirin, ibuprofen, naproxen, or other                            non-steroidal anti-inflammatory drugs for 2 weeks.                           - Await pathology results.                           - Repeat colonoscopy in 3 years for surveillance as                            already planned (if vigorous)                           -  CBC and ferritin were drawn today Procedure Code(s):        --- Professional ---                           [phone], 59, Colonoscopy, flexible; with control of                            bleeding, any method                           45385, Colonoscopy, flexible; with removal of                            tumor(s), polyp(s), or other lesion(s) by snare                            technique Diagnosis Code(s):        --- Professional ---                           K55.20, Angiodysplasia of colon without hemorrhage                           D12.4, Benign neoplasm of descending colon                           D12.2, Benign neoplasm of ascending colon                           Z98.0, Intestinal bypass and anastomosis status                           D50.0, Iron deficiency anemia secondary to blood                            loss (chronic) CPT copyright 2022 [employer]. All rights reserved. The codes documented in this report are preliminary and upon coder review may  be revised to meet current compliance requirements. Lupita FORBES Commander, MD 04/06/2024 10:22:24 AM This report has been signed electronically. Number of Addenda: 0

## 2024-04-07 ENCOUNTER — Ambulatory Visit: Payer: Self-pay | Admitting: Internal Medicine

## 2024-04-07 ENCOUNTER — Encounter (HOSPITAL_COMMUNITY): Payer: Self-pay | Admitting: Internal Medicine

## 2024-04-07 DIAGNOSIS — B9681 Helicobacter pylori [H. pylori] as the cause of diseases classified elsewhere: Secondary | ICD-10-CM | POA: Insufficient documentation

## 2024-04-07 DIAGNOSIS — D500 Iron deficiency anemia secondary to blood loss (chronic): Secondary | ICD-10-CM

## 2024-04-07 LAB — SURGICAL PATHOLOGY

## 2024-04-07 MED ORDER — OMEPRAZOLE 20 MG PO CPDR: 20.0000 mg | DELAYED_RELEASE_CAPSULE | Freq: Two times a day (BID) | ORAL | 0 refills | Status: AC

## 2024-04-07 MED ORDER — BISMUTH SUBSALICYLATE 262 MG PO CHEW: 524.0000 mg | CHEWABLE_TABLET | Freq: Four times a day (QID) | ORAL | Status: AC

## 2024-04-07 MED ORDER — DOXYCYCLINE MONOHYDRATE 100 MG PO TABS: 100.0000 mg | ORAL_TABLET | Freq: Two times a day (BID) | ORAL | 0 refills | Status: AC

## 2024-04-07 MED ORDER — METRONIDAZOLE 250 MG PO TABS: 250.0000 mg | ORAL_TABLET | Freq: Four times a day (QID) | ORAL | 0 refills | Status: AC

## 2024-04-07 NOTE — Telephone Encounter (Signed)
 Left message that patient has H. pylori gastritis and had adenomatous colon polyps.  Hemoglobin and ferritin now normal but I want him to stay on ferrous sulfate   I have prescribed treatment for H. pylori as follows:  1) Omeprazole 20 mg 2 times a day x 14 d 2) Pepto Bismol 2 tabs (262 mg each) 4 times a day x 14 d 3) Metronidazole 250 mg 4 times a day x 14 d 4) doxycycline 100 mg 2 times a day x 14 d  After 14 d stop omeprazole also  In 4 weeks after treatment completed do [place] H. Pylori stool antigen - dx H. Pylori gastritis  I have also ordered a CBC and ferritin to be done first week of January  MyChart message to the patient and I will talk to him again when we are able to connect

## 2024-04-07 NOTE — Progress Notes (Signed)
 Left message for patient see phone note also Has H. pylori gastritis will need treatment and follow-up testing Please see the phone note

## 2024-04-11 NOTE — Telephone Encounter (Signed)
 I was able to review things in person with the patient.  He understands the plan.  He wants to get CBC and ferritin through PCP Dr. Jakie in January.  I told him that was okay.

## 2024-04-12 ENCOUNTER — Telehealth: Payer: Self-pay | Admitting: Internal Medicine

## 2024-04-12 NOTE — Telephone Encounter (Signed)
 PT was recently prescribed new medications and he wanted to update that he has loose stools that are dark. He is also taking equate, an anti diarrhea medication. He would like to discuss any other options he may have. Please advise.

## 2024-04-13 NOTE — Telephone Encounter (Signed)
 The pt is taking treatment for h pylori and has noticed some dark stools.  I did assure him that the pepto in the treatment can cause dark/black stools.  He thanked me for the call and explanation and will call back if the stools do not return to normal after stopping treatment.

## 2024-04-13 NOTE — Telephone Encounter (Signed)
 Left message on machine to call back

## 2024-04-18 ENCOUNTER — Other Ambulatory Visit: Payer: Self-pay | Admitting: Internal Medicine

## 2024-04-21 DIAGNOSIS — M1811 Unilateral primary osteoarthritis of first carpometacarpal joint, right hand: Secondary | ICD-10-CM | POA: Diagnosis not present

## 2024-04-21 DIAGNOSIS — M79644 Pain in right finger(s): Secondary | ICD-10-CM | POA: Diagnosis not present

## 2024-05-15 ENCOUNTER — Telehealth: Payer: Self-pay | Admitting: Internal Medicine

## 2024-05-15 ENCOUNTER — Other Ambulatory Visit: Payer: Self-pay | Admitting: Internal Medicine

## 2024-05-15 DIAGNOSIS — B9681 Helicobacter pylori [H. pylori] as the cause of diseases classified elsewhere: Secondary | ICD-10-CM

## 2024-05-15 NOTE — Telephone Encounter (Signed)
 The pt has been advised that he can pick up the stool kit today as ordered in the basement lab.

## 2024-05-15 NOTE — Telephone Encounter (Signed)
 Inbound call from patient calling in regards to picking up a stool sample kit? Please advise.

## 2024-05-17 ENCOUNTER — Other Ambulatory Visit

## 2024-05-17 DIAGNOSIS — B9681 Helicobacter pylori [H. pylori] as the cause of diseases classified elsewhere: Secondary | ICD-10-CM

## 2024-05-17 DIAGNOSIS — K2970 Gastritis, unspecified, without bleeding: Secondary | ICD-10-CM | POA: Diagnosis not present

## 2024-05-18 LAB — HELICOBACTER PYLORI  SPECIAL ANTIGEN
MICRO NUMBER:: 17307631
SPECIMEN QUALITY: ADEQUATE

## 2024-05-19 ENCOUNTER — Ambulatory Visit: Payer: Self-pay | Admitting: Internal Medicine

## 2024-05-25 DIAGNOSIS — Z1212 Encounter for screening for malignant neoplasm of rectum: Secondary | ICD-10-CM | POA: Diagnosis not present

## 2024-05-25 DIAGNOSIS — R82998 Other abnormal findings in urine: Secondary | ICD-10-CM | POA: Diagnosis not present

## 2024-05-25 DIAGNOSIS — D649 Anemia, unspecified: Secondary | ICD-10-CM | POA: Diagnosis not present
